# Patient Record
Sex: Male | Born: 1940 | Race: White | Hispanic: No | Marital: Married | Smoking: Never smoker
Health system: Southern US, Community
[De-identification: ages and names within clinical notes are randomized; demographics above are authoritative.]

## PROBLEM LIST (undated history)

## (undated) DIAGNOSIS — K219 Gastro-esophageal reflux disease without esophagitis: Secondary | ICD-10-CM

## (undated) DIAGNOSIS — M1990 Unspecified osteoarthritis, unspecified site: Secondary | ICD-10-CM

## (undated) DIAGNOSIS — M353 Polymyalgia rheumatica: Secondary | ICD-10-CM

## (undated) DIAGNOSIS — Z974 Presence of external hearing-aid: Secondary | ICD-10-CM

## (undated) DIAGNOSIS — G4730 Sleep apnea, unspecified: Secondary | ICD-10-CM

## (undated) HISTORY — PX: COLONOSCOPY: SHX174

---

## 2010-12-13 DIAGNOSIS — K5730 Diverticulosis of large intestine without perforation or abscess without bleeding: Secondary | ICD-10-CM | POA: Insufficient documentation

## 2010-12-13 DIAGNOSIS — N400 Enlarged prostate without lower urinary tract symptoms: Secondary | ICD-10-CM | POA: Insufficient documentation

## 2010-12-13 DIAGNOSIS — E785 Hyperlipidemia, unspecified: Secondary | ICD-10-CM | POA: Insufficient documentation

## 2010-12-13 DIAGNOSIS — H9190 Unspecified hearing loss, unspecified ear: Secondary | ICD-10-CM | POA: Insufficient documentation

## 2010-12-13 DIAGNOSIS — K589 Irritable bowel syndrome without diarrhea: Secondary | ICD-10-CM | POA: Insufficient documentation

## 2010-12-24 DIAGNOSIS — M19011 Primary osteoarthritis, right shoulder: Secondary | ICD-10-CM | POA: Insufficient documentation

## 2011-01-21 DIAGNOSIS — H02059 Trichiasis without entropian unspecified eye, unspecified eyelid: Secondary | ICD-10-CM | POA: Insufficient documentation

## 2011-01-21 DIAGNOSIS — H02209 Unspecified lagophthalmos unspecified eye, unspecified eyelid: Secondary | ICD-10-CM | POA: Insufficient documentation

## 2011-10-14 DIAGNOSIS — K2970 Gastritis, unspecified, without bleeding: Secondary | ICD-10-CM | POA: Insufficient documentation

## 2013-07-20 DIAGNOSIS — M159 Polyosteoarthritis, unspecified: Secondary | ICD-10-CM | POA: Insufficient documentation

## 2014-07-27 DIAGNOSIS — L600 Ingrowing nail: Secondary | ICD-10-CM | POA: Insufficient documentation

## 2014-08-11 DIAGNOSIS — D696 Thrombocytopenia, unspecified: Secondary | ICD-10-CM | POA: Insufficient documentation

## 2016-04-16 DIAGNOSIS — M65341 Trigger finger, right ring finger: Secondary | ICD-10-CM | POA: Insufficient documentation

## 2016-04-16 DIAGNOSIS — M19041 Primary osteoarthritis, right hand: Secondary | ICD-10-CM | POA: Insufficient documentation

## 2017-08-06 DIAGNOSIS — M1712 Unilateral primary osteoarthritis, left knee: Secondary | ICD-10-CM | POA: Insufficient documentation

## 2018-08-12 DIAGNOSIS — M154 Erosive (osteo)arthritis: Secondary | ICD-10-CM | POA: Insufficient documentation

## 2018-08-12 DIAGNOSIS — M353 Polymyalgia rheumatica: Secondary | ICD-10-CM | POA: Insufficient documentation

## 2018-08-12 DIAGNOSIS — Z862 Personal history of diseases of the blood and blood-forming organs and certain disorders involving the immune mechanism: Secondary | ICD-10-CM | POA: Insufficient documentation

## 2018-08-12 DIAGNOSIS — Z7952 Long term (current) use of systemic steroids: Secondary | ICD-10-CM | POA: Insufficient documentation

## 2018-11-29 DIAGNOSIS — H6692 Otitis media, unspecified, left ear: Secondary | ICD-10-CM | POA: Insufficient documentation

## 2019-11-29 DIAGNOSIS — R739 Hyperglycemia, unspecified: Secondary | ICD-10-CM | POA: Insufficient documentation

## 2020-02-16 ENCOUNTER — Encounter: Payer: Self-pay | Admitting: Ophthalmology

## 2020-02-16 ENCOUNTER — Other Ambulatory Visit: Payer: Self-pay

## 2020-02-20 ENCOUNTER — Other Ambulatory Visit
Admission: RE | Admit: 2020-02-20 | Discharge: 2020-02-20 | Disposition: A | Discharge status: Home / Self Care | Payer: Medicare Other | Source: Ambulatory Visit | Attending: Ophthalmology | Admitting: Ophthalmology

## 2020-02-20 ENCOUNTER — Other Ambulatory Visit: Payer: Self-pay

## 2020-02-20 DIAGNOSIS — Z20822 Contact with and (suspected) exposure to covid-19: Secondary | ICD-10-CM | POA: Diagnosis not present

## 2020-02-20 DIAGNOSIS — Z01812 Encounter for preprocedural laboratory examination: Secondary | ICD-10-CM | POA: Diagnosis present

## 2020-02-20 LAB — SARS CORONAVIRUS 2 (TAT 6-24 HRS): SARS Coronavirus 2: NEGATIVE

## 2020-02-20 NOTE — Discharge Instructions (Signed)

## 2020-02-22 ENCOUNTER — Ambulatory Visit: Payer: Medicare Other | Admitting: Anesthesiology

## 2020-02-22 ENCOUNTER — Encounter: Payer: Self-pay | Admitting: Ophthalmology

## 2020-02-22 ENCOUNTER — Other Ambulatory Visit: Payer: Self-pay

## 2020-02-22 ENCOUNTER — Ambulatory Visit
Admission: RE | Admit: 2020-02-22 | Discharge: 2020-02-22 | Disposition: A | Discharge status: Home / Self Care | Payer: Medicare Other | Attending: Ophthalmology | Admitting: Ophthalmology

## 2020-02-22 ENCOUNTER — Encounter
Admission: RE | Disposition: A | Discharge status: Home / Self Care | Payer: Self-pay | Source: Home / Self Care | Attending: Ophthalmology

## 2020-02-22 DIAGNOSIS — G4730 Sleep apnea, unspecified: Secondary | ICD-10-CM | POA: Insufficient documentation

## 2020-02-22 DIAGNOSIS — N400 Enlarged prostate without lower urinary tract symptoms: Secondary | ICD-10-CM | POA: Insufficient documentation

## 2020-02-22 DIAGNOSIS — M353 Polymyalgia rheumatica: Secondary | ICD-10-CM | POA: Diagnosis not present

## 2020-02-22 DIAGNOSIS — H2511 Age-related nuclear cataract, right eye: Secondary | ICD-10-CM | POA: Insufficient documentation

## 2020-02-22 DIAGNOSIS — Z7952 Long term (current) use of systemic steroids: Secondary | ICD-10-CM | POA: Diagnosis not present

## 2020-02-22 DIAGNOSIS — M1990 Unspecified osteoarthritis, unspecified site: Secondary | ICD-10-CM | POA: Diagnosis not present

## 2020-02-22 DIAGNOSIS — K219 Gastro-esophageal reflux disease without esophagitis: Secondary | ICD-10-CM | POA: Diagnosis not present

## 2020-02-22 DIAGNOSIS — Z79899 Other long term (current) drug therapy: Secondary | ICD-10-CM | POA: Diagnosis not present

## 2020-02-22 DIAGNOSIS — E7800 Pure hypercholesterolemia, unspecified: Secondary | ICD-10-CM | POA: Diagnosis not present

## 2020-02-22 HISTORY — DX: Gastro-esophageal reflux disease without esophagitis: K21.9

## 2020-02-22 HISTORY — DX: Sleep apnea, unspecified: G47.30

## 2020-02-22 HISTORY — DX: Unspecified osteoarthritis, unspecified site: M19.90

## 2020-02-22 HISTORY — PX: CATARACT EXTRACTION W/PHACO: SHX586

## 2020-02-22 HISTORY — DX: Polymyalgia rheumatica: M35.3

## 2020-02-22 HISTORY — DX: Presence of external hearing-aid: Z97.4

## 2020-02-22 SURGERY — PHACOEMULSIFICATION, CATARACT, WITH IOL INSERTION
Anesthesia: Monitor Anesthesia Care | Site: Eye | Laterality: Right

## 2020-02-22 MED ORDER — MIDAZOLAM HCL 2 MG/2ML IJ SOLN
INTRAMUSCULAR | Status: DC | PRN
  Administered 2020-02-22 (×2): 1 mg via INTRAVENOUS

## 2020-02-22 MED ORDER — TETRACAINE HCL 0.5 % OP SOLN
1.0000 [drp] | OPHTHALMIC | Status: DC | PRN
  Administered 2020-02-22 (×3): 1 [drp] via OPHTHALMIC

## 2020-02-22 MED ORDER — LIDOCAINE HCL (PF) 2 % IJ SOLN
INTRAOCULAR | Status: DC | PRN
  Administered 2020-02-22: 2 mL

## 2020-02-22 MED ORDER — FENTANYL CITRATE (PF) 100 MCG/2ML IJ SOLN
INTRAMUSCULAR | Status: DC | PRN
  Administered 2020-02-22 (×2): 50 ug via INTRAVENOUS

## 2020-02-22 MED ORDER — ARMC OPHTHALMIC DILATING DROPS
1.0000 "application " | OPHTHALMIC | Status: DC | PRN
  Administered 2020-02-22 (×3): 1 via OPHTHALMIC

## 2020-02-22 MED ORDER — NA HYALUR & NA CHOND-NA HYALUR 0.4-0.35 ML IO KIT
PACK | INTRAOCULAR | Status: DC | PRN
  Administered 2020-02-22: 1 mL via INTRAOCULAR

## 2020-02-22 MED ORDER — CEFUROXIME OPHTHALMIC INJECTION 1 MG/0.1 ML
INJECTION | OPHTHALMIC | Status: DC | PRN
  Administered 2020-02-22: 0.1 mL via INTRACAMERAL

## 2020-02-22 MED ORDER — EPINEPHRINE PF 1 MG/ML IJ SOLN
INTRAOCULAR | Status: DC | PRN
  Administered 2020-02-22: 79 mL via OPHTHALMIC

## 2020-02-22 MED ORDER — MOXIFLOXACIN HCL 0.5 % OP SOLN
1.0000 [drp] | OPHTHALMIC | Status: DC | PRN
  Administered 2020-02-22 (×3): 1 [drp] via OPHTHALMIC

## 2020-02-22 MED ORDER — BRIMONIDINE TARTRATE-TIMOLOL 0.2-0.5 % OP SOLN
OPHTHALMIC | Status: DC | PRN
  Administered 2020-02-22: 1 [drp] via OPHTHALMIC

## 2020-02-22 SURGICAL SUPPLY — 23 items
CANNULA ANT/CHMB 27G (MISCELLANEOUS) ×1 IMPLANT
CANNULA ANT/CHMB 27GA (MISCELLANEOUS) ×2 IMPLANT
GLOVE SURG LX 7.5 STRW (GLOVE) ×1
GLOVE SURG LX STRL 7.5 STRW (GLOVE) ×1 IMPLANT
GLOVE SURG TRIUMPH 8.0 PF LTX (GLOVE) ×2 IMPLANT
GOWN STRL REUS W/ TWL LRG LVL3 (GOWN DISPOSABLE) ×2 IMPLANT
GOWN STRL REUS W/TWL LRG LVL3 (GOWN DISPOSABLE) ×2
LENS IOL DIOP 22.0 (Intraocular Lens) ×2 IMPLANT
LENS IOL TECNIS MONO 22.0 (Intraocular Lens) IMPLANT
MARKER SKIN DUAL TIP RULER LAB (MISCELLANEOUS) ×2 IMPLANT
NDL CAPSULORHEX 25GA (NEEDLE) ×1 IMPLANT
NDL FILTER BLUNT 18X1 1/2 (NEEDLE) ×2 IMPLANT
NEEDLE CAPSULORHEX 25GA (NEEDLE) ×2 IMPLANT
NEEDLE FILTER BLUNT 18X 1/2SAF (NEEDLE) ×2
NEEDLE FILTER BLUNT 18X1 1/2 (NEEDLE) ×2 IMPLANT
PACK CATARACT BRASINGTON (MISCELLANEOUS) ×2 IMPLANT
PACK EYE AFTER SURG (MISCELLANEOUS) ×2 IMPLANT
PACK OPTHALMIC (MISCELLANEOUS) ×2 IMPLANT
SOLUTION OPHTHALMIC SALT (MISCELLANEOUS) ×2 IMPLANT
SYR 3ML LL SCALE MARK (SYRINGE) ×4 IMPLANT
SYR TB 1ML LUER SLIP (SYRINGE) ×2 IMPLANT
WATER STERILE IRR 250ML POUR (IV SOLUTION) ×2 IMPLANT
WIPE NON LINTING 3.25X3.25 (MISCELLANEOUS) ×2 IMPLANT

## 2020-02-22 NOTE — Anesthesia Procedure Notes (Signed)
Procedure Name: MAC Date/Time: 02/22/2020 8:11 AM Performed by: Vanetta Shawl, CRNA Pre-anesthesia Checklist: Patient identified, Emergency Drugs available, Suction available, Timeout performed and Patient being monitored Patient Re-evaluated:Patient Re-evaluated prior to induction Oxygen Delivery Method: Nasal cannula Placement Confirmation: positive ETCO2

## 2020-02-22 NOTE — Anesthesia Preprocedure Evaluation (Signed)
Anesthesia Evaluation  Patient identified by MRN, date of birth, ID band Patient awake    History of Anesthesia Complications Negative for: history of anesthetic complications  Airway Mallampati: II  TM Distance: >3 FB Neck ROM: Full    Dental no notable dental hx.    Pulmonary sleep apnea ,    Pulmonary exam normal        Cardiovascular Exercise Tolerance: Good negative cardio ROS Normal cardiovascular exam     Neuro/Psych negative neurological ROS  negative psych ROS   GI/Hepatic Neg liver ROS, GERD  Medicated and Controlled,  Endo/Other  negative endocrine ROS  Renal/GU negative Renal ROS     Musculoskeletal Polymyalgia rheumatica   Abdominal   Peds  Hematology negative hematology ROS (+)   Anesthesia Other Findings   Reproductive/Obstetrics                             Anesthesia Physical Anesthesia Plan  ASA: III  Anesthesia Plan: MAC   Post-op Pain Management:    Induction: Intravenous  PONV Risk Score and Plan: 1 and Midazolam  Airway Management Planned: Nasal Cannula and Natural Airway  Additional Equipment: None  Intra-op Plan:   Post-operative Plan:   Informed Consent: I have reviewed the patients History and Physical, chart, labs and discussed the procedure including the risks, benefits and alternatives for the proposed anesthesia with the patient or authorized representative who has indicated his/her understanding and acceptance.       Plan Discussed with: CRNA  Anesthesia Plan Comments:         Anesthesia Quick Evaluation

## 2020-02-22 NOTE — Op Note (Signed)
LOCATION:  [facility]   PREOPERATIVE DIAGNOSIS:    Nuclear sclerotic cataract right eye. H25.11   POSTOPERATIVE DIAGNOSIS:  Nuclear sclerotic cataract right eye.     PROCEDURE:  Phacoemusification with posterior chamber intraocular lens placement of the right eye   ULTRASOUND TIME: Procedure(s) with comments: CATARACT EXTRACTION PHACO AND INTRAOCULAR LENS PLACEMENT (IOC) RIGHT 14.63 01:24.4 17.3% (Right) - sleep apnea  LENS:   Implant Name Type Inv. Item Serial No. Manufacturer Lot No. LRB No. Used Action  LENS IOL DIOP 22.0 - [id number] Intraocular Lens LENS IOL DIOP 22.0 [id number]  Right 1 Implanted         SURGEON:  Wyonia Hough, MD   ANESTHESIA:  Topical with tetracaine drops and 2% Xylocaine jelly, augmented with 1% preservative-free intracameral lidocaine.    COMPLICATIONS:  None.   DESCRIPTION OF PROCEDURE:  The patient was identified in the holding room and transported to the operating room and placed in the supine position under the operating microscope.  The right eye was identified as the operative eye and it was prepped and draped in the usual sterile ophthalmic fashion.   A 1 millimeter clear-corneal paracentesis was made at the 12:00 position.  0.5 ml of preservative-free 1% lidocaine was injected into the anterior chamber. The anterior chamber was filled with Viscoat viscoelastic.  A 2.4 millimeter keratome was used to make a near-clear corneal incision at the 9:00 position.  A curvilinear capsulorrhexis was made with a cystotome and capsulorrhexis forceps.  Balanced salt solution was used to hydrodissect and hydrodelineate the nucleus.   Phacoemulsification was then used in stop and chop fashion to remove the lens nucleus and epinucleus.  The remaining cortex was then removed using the irrigation and aspiration handpiece. Provisc was then placed into the capsular bag to distend it for lens placement.  A lens was then injected into the capsular  bag.  The remaining viscoelastic was aspirated.   Wounds were hydrated with balanced salt solution.  The anterior chamber was inflated to a physiologic pressure with balanced salt solution.  No wound leaks were noted. Cefuroxime 0.1 ml of a 10mg /ml solution was injected into the anterior chamber for a dose of 1 mg of intracameral antibiotic at the completion of the case.   Timolol and Brimonidine drops were applied to the eye.  The patient was taken to the recovery room in stable condition without complications of anesthesia or surgery.   Hellen Shanley 02/22/2020, 8:32 AM

## 2020-02-22 NOTE — H&P (Signed)

## 2020-02-22 NOTE — Transfer of Care (Signed)
Immediate Anesthesia Transfer of Care Note  Patient: Benjamin Deleon  Procedure(s) Performed: CATARACT EXTRACTION PHACO AND INTRAOCULAR LENS PLACEMENT (IOC) RIGHT 14.63 01:24.4 17.3% (Right Eye)  Patient Location: PACU  Anesthesia Type: MAC  Level of Consciousness: awake, alert  and patient cooperative  Airway and Oxygen Therapy: Patient Spontanous Breathing   Post-op Assessment: Post-op Vital signs reviewed, Patient's Cardiovascular Status Stable, Respiratory Function Stable, Patent Airway and No signs of Nausea or vomiting  Post-op Vital Signs: Reviewed and stable  Complications: No apparent anesthesia complications

## 2020-02-22 NOTE — Anesthesia Postprocedure Evaluation (Signed)
Anesthesia Post Note  Patient: Benjamin Deleon  Procedure(s) Performed: CATARACT EXTRACTION PHACO AND INTRAOCULAR LENS PLACEMENT (IOC) RIGHT 14.63 01:24.4 17.3% (Right Eye)     Patient location during evaluation: PACU Anesthesia Type: MAC Level of consciousness: awake and alert Pain management: pain level controlled Vital Signs Assessment: post-procedure vital signs reviewed and stable Respiratory status: spontaneous breathing Cardiovascular status: blood pressure returned to baseline Postop Assessment: no apparent nausea or vomiting, adequate PO intake and no headache Anesthetic complications: no    Adele Barthel Tagg Eustice

## 2020-02-23 ENCOUNTER — Encounter: Payer: Self-pay | Admitting: *Deleted

## 2020-03-12 ENCOUNTER — Encounter: Payer: Self-pay | Admitting: Ophthalmology

## 2020-03-19 ENCOUNTER — Other Ambulatory Visit
Admission: RE | Admit: 2020-03-19 | Discharge: 2020-03-19 | Disposition: A | Discharge status: Home / Self Care | Payer: Medicare Other | Source: Ambulatory Visit | Attending: Ophthalmology | Admitting: Ophthalmology

## 2020-03-19 ENCOUNTER — Other Ambulatory Visit: Payer: Self-pay

## 2020-03-19 DIAGNOSIS — Z01812 Encounter for preprocedural laboratory examination: Secondary | ICD-10-CM | POA: Diagnosis present

## 2020-03-19 DIAGNOSIS — Z20822 Contact with and (suspected) exposure to covid-19: Secondary | ICD-10-CM | POA: Insufficient documentation

## 2020-03-19 LAB — SARS CORONAVIRUS 2 (TAT 6-24 HRS): SARS Coronavirus 2: NEGATIVE

## 2020-03-20 NOTE — Discharge Instructions (Signed)

## 2020-03-21 ENCOUNTER — Ambulatory Visit: Payer: Medicare Other | Admitting: Anesthesiology

## 2020-03-21 ENCOUNTER — Ambulatory Visit
Admission: RE | Admit: 2020-03-21 | Discharge: 2020-03-21 | Disposition: A | Discharge status: Home / Self Care | Payer: Medicare Other | Attending: Ophthalmology | Admitting: Ophthalmology

## 2020-03-21 ENCOUNTER — Encounter: Payer: Self-pay | Admitting: Ophthalmology

## 2020-03-21 ENCOUNTER — Other Ambulatory Visit: Payer: Self-pay

## 2020-03-21 ENCOUNTER — Encounter
Admission: RE | Disposition: A | Discharge status: Home / Self Care | Payer: Self-pay | Source: Home / Self Care | Attending: Ophthalmology

## 2020-03-21 DIAGNOSIS — E7800 Pure hypercholesterolemia, unspecified: Secondary | ICD-10-CM | POA: Diagnosis not present

## 2020-03-21 DIAGNOSIS — H2512 Age-related nuclear cataract, left eye: Secondary | ICD-10-CM | POA: Diagnosis not present

## 2020-03-21 DIAGNOSIS — K219 Gastro-esophageal reflux disease without esophagitis: Secondary | ICD-10-CM | POA: Diagnosis not present

## 2020-03-21 DIAGNOSIS — M1990 Unspecified osteoarthritis, unspecified site: Secondary | ICD-10-CM | POA: Diagnosis not present

## 2020-03-21 DIAGNOSIS — Z9841 Cataract extraction status, right eye: Secondary | ICD-10-CM | POA: Diagnosis not present

## 2020-03-21 DIAGNOSIS — N400 Enlarged prostate without lower urinary tract symptoms: Secondary | ICD-10-CM | POA: Diagnosis not present

## 2020-03-21 DIAGNOSIS — M353 Polymyalgia rheumatica: Secondary | ICD-10-CM | POA: Diagnosis not present

## 2020-03-21 DIAGNOSIS — G4730 Sleep apnea, unspecified: Secondary | ICD-10-CM | POA: Diagnosis not present

## 2020-03-21 HISTORY — PX: CATARACT EXTRACTION W/PHACO: SHX586

## 2020-03-21 SURGERY — PHACOEMULSIFICATION, CATARACT, WITH IOL INSERTION
Anesthesia: Monitor Anesthesia Care | Site: Eye | Laterality: Left

## 2020-03-21 MED ORDER — CEFUROXIME OPHTHALMIC INJECTION 1 MG/0.1 ML
INJECTION | OPHTHALMIC | Status: DC | PRN
  Administered 2020-03-21: 0.1 mL via INTRACAMERAL

## 2020-03-21 MED ORDER — EPINEPHRINE PF 1 MG/ML IJ SOLN
INTRAOCULAR | Status: DC | PRN
  Administered 2020-03-21: 75 mL via OPHTHALMIC

## 2020-03-21 MED ORDER — ACETAMINOPHEN 160 MG/5ML PO SOLN: 325.0000 mg | Freq: Once | ORAL | Status: DC

## 2020-03-21 MED ORDER — FENTANYL CITRATE (PF) 100 MCG/2ML IJ SOLN
INTRAMUSCULAR | Status: DC | PRN
  Administered 2020-03-21 (×2): 50 ug via INTRAVENOUS

## 2020-03-21 MED ORDER — MIDAZOLAM HCL 2 MG/2ML IJ SOLN
INTRAMUSCULAR | Status: DC | PRN
  Administered 2020-03-21 (×2): 1 mg via INTRAVENOUS

## 2020-03-21 MED ORDER — MOXIFLOXACIN HCL 0.5 % OP SOLN
1.0000 [drp] | OPHTHALMIC | Status: DC | PRN
  Administered 2020-03-21 (×3): 1 [drp] via OPHTHALMIC

## 2020-03-21 MED ORDER — ARMC OPHTHALMIC DILATING DROPS
1.0000 "application " | OPHTHALMIC | Status: DC | PRN
  Administered 2020-03-21 (×3): 1 via OPHTHALMIC

## 2020-03-21 MED ORDER — ACETAMINOPHEN 325 MG PO TABS: 325.0000 mg | ORAL_TABLET | Freq: Once | ORAL | Status: DC

## 2020-03-21 MED ORDER — NA HYALUR & NA CHOND-NA HYALUR 0.4-0.35 ML IO KIT
PACK | INTRAOCULAR | Status: DC | PRN
  Administered 2020-03-21: 1 mL via INTRAOCULAR

## 2020-03-21 MED ORDER — LIDOCAINE HCL (PF) 2 % IJ SOLN
INTRAOCULAR | Status: DC | PRN
  Administered 2020-03-21: 1 mL

## 2020-03-21 MED ORDER — TETRACAINE HCL 0.5 % OP SOLN
1.0000 [drp] | OPHTHALMIC | Status: DC | PRN
  Administered 2020-03-21 (×3): 1 [drp] via OPHTHALMIC

## 2020-03-21 MED ORDER — LACTATED RINGERS IV SOLN: 10.0000 mL/h | INTRAVENOUS | Status: DC

## 2020-03-21 MED ORDER — BRIMONIDINE TARTRATE-TIMOLOL 0.2-0.5 % OP SOLN
OPHTHALMIC | Status: DC | PRN
  Administered 2020-03-21: 1 [drp] via OPHTHALMIC

## 2020-03-21 SURGICAL SUPPLY — 23 items
CANNULA ANT/CHMB 27G (MISCELLANEOUS) ×1 IMPLANT
CANNULA ANT/CHMB 27GA (MISCELLANEOUS) ×3 IMPLANT
GLOVE SURG LX 7.5 STRW (GLOVE) ×2
GLOVE SURG LX STRL 7.5 STRW (GLOVE) ×1 IMPLANT
GLOVE SURG TRIUMPH 8.0 PF LTX (GLOVE) ×3 IMPLANT
GOWN STRL REUS W/ TWL LRG LVL3 (GOWN DISPOSABLE) ×2 IMPLANT
GOWN STRL REUS W/TWL LRG LVL3 (GOWN DISPOSABLE) ×4
LENS IOL DIOP 20.0 (Intraocular Lens) ×3 IMPLANT
LENS IOL TECNIS MONO 20.0 (Intraocular Lens) IMPLANT
MARKER SKIN DUAL TIP RULER LAB (MISCELLANEOUS) ×3 IMPLANT
NDL CAPSULORHEX 25GA (NEEDLE) ×1 IMPLANT
NDL FILTER BLUNT 18X1 1/2 (NEEDLE) ×2 IMPLANT
NEEDLE CAPSULORHEX 25GA (NEEDLE) ×3 IMPLANT
NEEDLE FILTER BLUNT 18X 1/2SAF (NEEDLE) ×4
NEEDLE FILTER BLUNT 18X1 1/2 (NEEDLE) ×2 IMPLANT
PACK CATARACT BRASINGTON (MISCELLANEOUS) ×3 IMPLANT
PACK EYE AFTER SURG (MISCELLANEOUS) ×3 IMPLANT
PACK OPTHALMIC (MISCELLANEOUS) ×3 IMPLANT
SOLUTION OPHTHALMIC SALT (MISCELLANEOUS) ×3 IMPLANT
SYR 3ML LL SCALE MARK (SYRINGE) ×6 IMPLANT
SYR TB 1ML LUER SLIP (SYRINGE) ×3 IMPLANT
WATER STERILE IRR 250ML POUR (IV SOLUTION) ×3 IMPLANT
WIPE NON LINTING 3.25X3.25 (MISCELLANEOUS) ×3 IMPLANT

## 2020-03-21 NOTE — Transfer of Care (Signed)
Immediate Anesthesia Transfer of Care Note  Patient: Benjamin Deleon  Procedure(s) Performed: CATARACT EXTRACTION PHACO AND INTRAOCULAR LENS PLACEMENT (IOC) LEFT 8.85  01:05.1  13.6% (Left Eye)  Patient Location: PACU  Anesthesia Type: MAC  Level of Consciousness: awake, alert  and patient cooperative  Airway and Oxygen Therapy: Patient Spontanous Breathing   Post-op Assessment: Post-op Vital signs reviewed, Patient's Cardiovascular Status Stable, Respiratory Function Stable, Patent Airway and No signs of Nausea or vomiting  Post-op Vital Signs: Reviewed and stable  Complications: No complications documented.

## 2020-03-21 NOTE — Anesthesia Postprocedure Evaluation (Signed)
Anesthesia Post Note  Patient: Benjamin Deleon  Procedure(s) Performed: CATARACT EXTRACTION PHACO AND INTRAOCULAR LENS PLACEMENT (IOC) LEFT 8.85  01:05.1  13.6% (Left Eye)     Patient location during evaluation: PACU Anesthesia Type: MAC Level of consciousness: awake and alert and oriented Pain management: satisfactory to patient Vital Signs Assessment: post-procedure vital signs reviewed and stable Respiratory status: spontaneous breathing, nonlabored ventilation and respiratory function stable Cardiovascular status: blood pressure returned to baseline and stable Postop Assessment: Adequate PO intake and No signs of nausea or vomiting Anesthetic complications: no   No complications documented.  Raliegh Ip

## 2020-03-21 NOTE — Op Note (Signed)
OPERATIVE NOTE  Benjamin Deleon [id number] 03/21/2020   PREOPERATIVE DIAGNOSIS:  Nuclear sclerotic cataract left eye. H25.12   POSTOPERATIVE DIAGNOSIS:    Nuclear sclerotic cataract left eye.     PROCEDURE:  Phacoemusification with posterior chamber intraocular lens placement of the left eye  Ultrasound time: Procedure(s): CATARACT EXTRACTION PHACO AND INTRAOCULAR LENS PLACEMENT (IOC) LEFT 8.85  01:05.1  13.6% (Left)  LENS:   Implant Name Type Inv. Item Serial No. Manufacturer Lot No. LRB No. Used Action  LENS IOL DIOP 20.0 - [id number] Intraocular Lens LENS IOL DIOP 20.0 [id number]  Left 1 Implanted      SURGEON:  Wyonia Hough, MD   ANESTHESIA:  Topical with tetracaine drops and 2% Xylocaine jelly, augmented with 1% preservative-free intracameral lidocaine.    COMPLICATIONS:  None.   DESCRIPTION OF PROCEDURE:  The patient was identified in the holding room and transported to the operating room and placed in the supine position under the operating microscope.  The left eye was identified as the operative eye and it was prepped and draped in the usual sterile ophthalmic fashion.   A 1 millimeter clear-corneal paracentesis was made at the 1:30 position.  0.5 ml of preservative-free 1% lidocaine was injected into the anterior chamber.  The anterior chamber was filled with Viscoat viscoelastic.  A 2.4 millimeter keratome was used to make a near-clear corneal incision at the 10:30 position.  .  A curvilinear capsulorrhexis was made with a cystotome and capsulorrhexis forceps.  Balanced salt solution was used to hydrodissect and hydrodelineate the nucleus.   Phacoemulsification was then used in stop and chop fashion to remove the lens nucleus and epinucleus.  The remaining cortex was then removed using the irrigation and aspiration handpiece. Provisc was then placed into the capsular bag to distend it for lens placement.  A lens was then injected into the capsular bag.  The  remaining viscoelastic was aspirated.   Wounds were hydrated with balanced salt solution.  The anterior chamber was inflated to a physiologic pressure with balanced salt solution.  No wound leaks were noted. Cefuroxime 0.1 ml of a 10mg /ml solution was injected into the anterior chamber for a dose of 1 mg of intracameral antibiotic at the completion of the case.   Timolol and Brimonidine drops were applied to the eye.  The patient was taken to the recovery room in stable condition without complications of anesthesia or surgery.  Aleene Swanner 03/21/2020, 12:36 PM

## 2020-03-21 NOTE — Anesthesia Procedure Notes (Signed)
Procedure Name: MAC Date/Time: 03/21/2020 12:15 PM Performed by: Vanetta Shawl, CRNA Pre-anesthesia Checklist: Patient identified, Emergency Drugs available, Suction available, Timeout performed and Patient being monitored Patient Re-evaluated:Patient Re-evaluated prior to induction Oxygen Delivery Method: Nasal cannula Placement Confirmation: positive ETCO2

## 2020-03-21 NOTE — Anesthesia Preprocedure Evaluation (Signed)
Anesthesia Evaluation  Patient identified by MRN, date of birth, ID band Patient awake    Reviewed: Allergy & Precautions, H&P , NPO status , Patient's Chart, lab work & pertinent test results  History of Anesthesia Complications Negative for: history of anesthetic complications  Airway Mallampati: II  TM Distance: >3 FB Neck ROM: full    Dental no notable dental hx.    Pulmonary sleep apnea ,    Pulmonary exam normal breath sounds clear to auscultation       Cardiovascular Exercise Tolerance: Good negative cardio ROS Normal cardiovascular exam Rhythm:regular Rate:Normal     Neuro/Psych negative neurological ROS  negative psych ROS   GI/Hepatic Neg liver ROS, GERD  Medicated and Controlled,  Endo/Other  negative endocrine ROS  Renal/GU negative Renal ROS     Musculoskeletal Polymyalgia rheumatica   Abdominal   Peds  Hematology negative hematology ROS (+)   Anesthesia Other Findings   Reproductive/Obstetrics                             Anesthesia Physical  Anesthesia Plan  ASA: II  Anesthesia Plan: MAC   Post-op Pain Management:    Induction: Intravenous  PONV Risk Score and Plan: 1 and Treatment may vary due to age or medical condition, TIVA and Midazolam  Airway Management Planned: Nasal Cannula and Natural Airway  Additional Equipment: None  Intra-op Plan:   Post-operative Plan:   Informed Consent: I have reviewed the patients History and Physical, chart, labs and discussed the procedure including the risks, benefits and alternatives for the proposed anesthesia with the patient or authorized representative who has indicated his/her understanding and acceptance.     Dental Advisory Given  Plan Discussed with: CRNA  Anesthesia Plan Comments:         Anesthesia Quick Evaluation

## 2020-03-21 NOTE — H&P (Signed)

## 2020-03-22 ENCOUNTER — Encounter: Payer: Self-pay | Admitting: Ophthalmology

## 2020-03-22 ENCOUNTER — Ambulatory Visit: Payer: Self-pay | Admitting: Dermatology

## 2020-06-13 ENCOUNTER — Ambulatory Visit (INDEPENDENT_AMBULATORY_CARE_PROVIDER_SITE_OTHER): Payer: Medicare Other | Admitting: Dermatology

## 2020-06-13 ENCOUNTER — Other Ambulatory Visit: Payer: Self-pay

## 2020-06-13 DIAGNOSIS — D2310 Other benign neoplasm of skin of unspecified eyelid, including canthus: Secondary | ICD-10-CM

## 2020-06-13 DIAGNOSIS — Z1283 Encounter for screening for malignant neoplasm of skin: Secondary | ICD-10-CM | POA: Diagnosis not present

## 2020-06-13 DIAGNOSIS — L821 Other seborrheic keratosis: Secondary | ICD-10-CM

## 2020-06-13 DIAGNOSIS — D2261 Melanocytic nevi of right upper limb, including shoulder: Secondary | ICD-10-CM | POA: Diagnosis not present

## 2020-06-13 DIAGNOSIS — L309 Dermatitis, unspecified: Secondary | ICD-10-CM

## 2020-06-13 DIAGNOSIS — D2262 Melanocytic nevi of left upper limb, including shoulder: Secondary | ICD-10-CM | POA: Diagnosis not present

## 2020-06-13 DIAGNOSIS — L720 Epidermal cyst: Secondary | ICD-10-CM

## 2020-06-13 DIAGNOSIS — L814 Other melanin hyperpigmentation: Secondary | ICD-10-CM

## 2020-06-13 DIAGNOSIS — D239 Other benign neoplasm of skin, unspecified: Secondary | ICD-10-CM

## 2020-06-13 DIAGNOSIS — D229 Melanocytic nevi, unspecified: Secondary | ICD-10-CM

## 2020-06-13 DIAGNOSIS — D492 Neoplasm of unspecified behavior of bone, soft tissue, and skin: Secondary | ICD-10-CM

## 2020-06-13 DIAGNOSIS — L820 Inflamed seborrheic keratosis: Secondary | ICD-10-CM | POA: Diagnosis not present

## 2020-06-13 DIAGNOSIS — D1800 Hemangioma unspecified site: Secondary | ICD-10-CM

## 2020-06-13 DIAGNOSIS — L578 Other skin changes due to chronic exposure to nonionizing radiation: Secondary | ICD-10-CM

## 2020-06-13 HISTORY — DX: Other benign neoplasm of skin, unspecified: D23.9

## 2020-06-13 NOTE — Patient Instructions (Signed)

## 2020-06-13 NOTE — Progress Notes (Signed)
New Patient Visit  Subjective  Benjamin Deleon is a 79 y.o. male who presents for the following: Annual Exam (Total body skin exam, no hx of skin ca, new patient, ), Skin Tag (chest, no symptoms), and growth (post neck, no symptoms). The patient presents for Total-Body Skin Exam (TBSE) for skin cancer screening and mole check.  The following portions of the chart were reviewed this encounter and updated as appropriate:  Tobacco  Allergies  Meds  Problems  Med Hx  Surg Hx  Fam Hx     Review of Systems:  No other skin or systemic complaints except as noted in HPI or Assessment and Plan.  Objective  Well appearing patient in no apparent distress; mood and affect are within normal limits.  A full examination was performed including scalp, head, eyes, ears, nose, lips, neck, chest, axillae, abdomen, back, buttocks, bilateral upper extremities, bilateral lower extremities, hands, feet, fingers, toes, fingernails, and toenails. All findings within normal limits unless otherwise noted below.  Objective  L medial canthus: 4.26mm pap  Images    Objective  L proximal tricep: 0.7 x 0.5cm irregular brown macule  Objective  Right distal lat deltoid: 2.43mm black macule  Objective  Left Popliteal Fossa: Pink patch  Objective  R xyphoid x 1: Erythematous keratotic or waxy stuck-on papule or plaque.   Objective  Right posterior hairline: 0.6cm cystic pap  Objective  Right Upper Arm - Posterior: Dark brown macules   Assessment & Plan    Lentigines - Scattered tan macules - Discussed due to sun exposure - Benign, observe - Call for any changes  Seborrheic Keratoses - Stuck-on, waxy, tan-brown papules and plaques  - Discussed benign etiology and prognosis. - Observe - Call for any changes  Melanocytic Nevi - Tan-brown and/or pink-flesh-colored symmetric macules and papules - Benign appearing on exam today - Observation - Call clinic for new or changing moles -  Recommend daily use of broad spectrum spf 30+ sunscreen to sun-exposed areas.   Hemangiomas - Red papules - Discussed benign nature - Observe - Call for any changes  Actinic Damage - diffuse scaly erythematous macules with underlying dyspigmentation - Recommend daily broad spectrum sunscreen SPF 30+ to sun-exposed areas, reapply every 2 hours as needed.  - Call for new or changing lesions.  Skin cancer screening performed today.  Hydrocystoma L medial canthus Benign appearing, observe  Neoplasm of skin (2) L proximal tricep  Epidermal / dermal shaving  Lesion diameter (cm):  0.7 Informed consent: discussed and consent obtained   Timeout: patient name, date of birth, surgical site, and procedure verified   Procedure prep:  Patient was prepped and draped in usual sterile fashion Prep type:  Isopropyl alcohol Anesthesia: the lesion was anesthetized in a standard fashion   Anesthetic:  1% lidocaine w/ epinephrine 1-100,000 buffered w/ 8.4% NaHCO3 Instrument used: flexible razor blade   Hemostasis achieved with: pressure, aluminum chloride and electrodesiccation   Outcome: patient tolerated procedure well   Post-procedure details: sterile dressing applied and wound care instructions given   Dressing type: bandage and petrolatum    Specimen 1 - Surgical pathology Differential Diagnosis: D48.5 Nevus vs Dysplastic Nevus Check Margins: yes 0.7 x 0.5cm irregular brown macule  Right distal lat deltoid  Skin / nail biopsy Type of biopsy: tangential   Informed consent: discussed and consent obtained   Timeout: patient name, date of birth, surgical site, and procedure verified   Procedure prep:  Patient was prepped and draped in usual  sterile fashion Prep type:  Isopropyl alcohol Anesthesia: the lesion was anesthetized in a standard fashion   Anesthetic:  1% lidocaine w/ epinephrine 1-100,000 buffered w/ 8.4% NaHCO3 Instrument used: flexible razor blade   Outcome: patient  tolerated procedure well   Post-procedure details: sterile dressing applied and wound care instructions given   Dressing type: bandage and petrolatum    Specimen 2 - Surgical pathology Differential Diagnosis: D48.5 Nevus vs Dysplastic Nevus Check Margins: yes 2.76mm black macule  Nevus vs Dysplastic Nevus  Eczema, unspecified type Left Popliteal Fossa Recommend otc Hydrocortisone cream qd prn itch  Inflamed seborrheic keratosis R xyphoid x 1  Destruction of lesion - R xyphoid x 1 Complexity: simple   Destruction method: cryotherapy   Informed consent: discussed and consent obtained   Timeout:  patient name, date of birth, surgical site, and procedure verified Lesion destroyed using liquid nitrogen: Yes   Region frozen until ice ball extended beyond lesion: Yes   Outcome: patient tolerated procedure well with no complications   Post-procedure details: wound care instructions given    Epidermal cyst Right posterior hairline Benign, discussed excising, pt would like to observe  Nevus Right Upper Arm - Posterior Plan removal if bx's from today are abnormal  Return in about 1 year (around 06/13/2021) for TBSE.   I, Othelia Pulling, RMA, am acting as scribe for Sarina Ser, MD .  Documentation: I have reviewed the above documentation for accuracy and completeness, and I agree with the above.  Sarina Ser, MD

## 2020-06-14 ENCOUNTER — Encounter: Payer: Self-pay | Admitting: Dermatology

## 2020-06-17 ENCOUNTER — Encounter: Payer: Self-pay | Admitting: Dermatology

## 2020-06-18 ENCOUNTER — Telehealth: Payer: Self-pay

## 2020-06-18 NOTE — Telephone Encounter (Signed)
Discussed biopsy results with pt  °

## 2020-06-18 NOTE — Telephone Encounter (Signed)
-----   Message from Ralene Bathe, MD sent at 06/17/2020  4:11 PM EDT ----- 1. 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 Skin , right distal lat deltoid DYSPLASTIC JUNCTIONAL NEVUS WITH MILD ATYPIA, LIMITED MARGINS FREE  1&2 - both Dysplastic Mild Recheck next visit Keep Sept 2022 visit

## 2020-07-02 DIAGNOSIS — M25511 Pain in right shoulder: Secondary | ICD-10-CM | POA: Insufficient documentation

## 2020-08-14 ENCOUNTER — Ambulatory Visit (INDEPENDENT_AMBULATORY_CARE_PROVIDER_SITE_OTHER): Payer: Medicare Other | Admitting: Dermatology

## 2020-08-14 ENCOUNTER — Encounter: Payer: Self-pay | Admitting: Dermatology

## 2020-08-14 ENCOUNTER — Other Ambulatory Visit: Payer: Self-pay

## 2020-08-14 DIAGNOSIS — L720 Epidermal cyst: Secondary | ICD-10-CM | POA: Diagnosis not present

## 2020-08-14 MED ORDER — MUPIROCIN 2 % EX OINT: 1.0000 "application " | TOPICAL_OINTMENT | Freq: Every day | CUTANEOUS | 0 refills | Status: DC

## 2020-08-14 NOTE — Patient Instructions (Signed)

## 2020-08-14 NOTE — Progress Notes (Signed)
   Follow-Up Visit   Subjective  Benjamin Deleon is a 79 y.o. male who presents for the following: Cyst (R post hairline, pt presents for excision).  The following portions of the chart were reviewed this encounter and updated as appropriate:  Tobacco  Allergies  Meds  Problems  Med Hx  Surg Hx  Fam Hx     Review of Systems:  No other skin or systemic complaints except as noted in HPI or Assessment and Plan.  Objective  Well appearing patient in no apparent distress; mood and affect are within normal limits.  A focused examination was performed including scalp. Relevant physical exam findings are noted in the Assessment and Plan.  Objective  R posterior hairline: Cystic pap 1.0cm   Assessment & Plan  Epidermal cyst -symptomatic and growing R posterior hairline -neck  Start Mupirocin oint qd to excision site with dressing changes  mupirocin ointment (BACTROBAN) 2 % - R posterior hairline  Skin excision - R posterior hairline neck  Lesion length (cm):  1 Lesion width (cm):  1 Margin per side (cm):  0.1 Total excision diameter (cm):  1.2 Informed consent: discussed and consent obtained   Timeout: patient name, date of birth, surgical site, and procedure verified   Procedure prep:  Patient was prepped and draped in usual sterile fashion Prep type:  Isopropyl alcohol and povidone-iodine Anesthesia: the lesion was anesthetized in a standard fashion   Anesthetic:  1% lidocaine w/ epinephrine 1-100,000 buffered w/ 8.4% NaHCO3 (6.0cc) Instrument used comment:  15c blade Hemostasis achieved with: pressure   Hemostasis achieved with comment:  Electrocautery Outcome: patient tolerated procedure well with no complications   Post-procedure details: sterile dressing applied and wound care instructions given   Dressing type: bandage, pressure dressing and bacitracin (Mupirocin)    Skin repair - R posterior hairline neck Complexity:  Complex Final length (cm):  2 Reason for  type of repair: reduce tension to allow closure, reduce the risk of dehiscence, infection, and necrosis, reduce subcutaneous dead space and avoid a hematoma, allow closure of the large defect, preserve normal anatomy, preserve normal anatomical and functional relationships and enhance both functionality and cosmetic results   Undermining: area extensively undermined   Undermining comment:  Undermining Defect 1.2cm Subcutaneous layers (deep stitches):  Suture size:  4-0 Suture type: Vicryl (polyglactin 910)   Subcutaneous suture technique: Inverted Dermal. Fine/surface layer approximation (top stitches):  Suture size:  4-0 Suture type: nylon   Stitches: horizontal mattress   Suture removal (days):  7 Hemostasis achieved with: pressure Outcome: patient tolerated procedure well with no complications   Post-procedure details: sterile dressing applied and wound care instructions given   Dressing type: bandage, pressure dressing and bacitracin (Mupirocin)    Specimen 1 - Surgical pathology Differential Diagnosis: D48.5 Cyst vs other Check Margins: No Cystic pap 1.0cm  Return in about 1 week (around 08/21/2020) for suture removal.  I, Othelia Pulling, RMA, am acting as scribe for Sarina Ser, MD .  Documentation: I have reviewed the above documentation for accuracy and completeness, and I agree with the above.  Sarina Ser, MD

## 2020-08-15 ENCOUNTER — Telehealth: Payer: Self-pay

## 2020-08-15 NOTE — Telephone Encounter (Signed)
Patient do good after yesterday's surgery.Benjamin Deleon

## 2020-08-21 ENCOUNTER — Ambulatory Visit (INDEPENDENT_AMBULATORY_CARE_PROVIDER_SITE_OTHER): Payer: Medicare Other | Admitting: Dermatology

## 2020-08-21 ENCOUNTER — Other Ambulatory Visit: Payer: Self-pay

## 2020-08-21 DIAGNOSIS — L720 Epidermal cyst: Secondary | ICD-10-CM

## 2020-08-21 DIAGNOSIS — Z4802 Encounter for removal of sutures: Secondary | ICD-10-CM

## 2020-08-21 NOTE — Progress Notes (Signed)
   Follow-Up Visit   Subjective  Benjamin Deleon is a 79 y.o. male who presents for the following: Cyst (pathology proven, patient is here today for suture removal S/P excision).  The following portions of the chart were reviewed this encounter and updated as appropriate:  Tobacco  Allergies  Meds  Problems  Med Hx  Surg Hx  Fam Hx     Review of Systems:  No other skin or systemic complaints except as noted in HPI or Assessment and Plan.  Objective  Well appearing patient in no apparent distress; mood and affect are within normal limits.  A focused examination was performed including the scalp and neck. Relevant physical exam findings are noted in the Assessment and Plan.  Objective  R post hairline/neck: Healing excision site  Assessment & Plan  Epidermal inclusion cyst R post hairline/neck  Encounter for Removal of Sutures - Incision site at the R post hairline/neck is clean, dry and intact - Wound cleansed, sutures removed, wound cleansed and steri strips applied.  - Discussed pathology results showing a benign cyst.  - Patient advised to keep steri-strips dry until they fall off. - Scars remodel for a full year. - Once steri-strips fall off, patient can apply over-the-counter silicone scar cream each night to help with scar remodeling if desired. - Patient advised to call with any concerns or if they notice any new or changing lesions.   Return for appointment as scheduled for TBSE .  [name], CMA, am acting as scribe for Sarina Ser, MD .  Documentation: I have reviewed the above documentation for accuracy and completeness, and I agree with the above.  Sarina Ser, MD

## 2020-08-23 ENCOUNTER — Encounter: Payer: Self-pay | Admitting: Dermatology

## 2020-10-12 ENCOUNTER — Ambulatory Visit (INDEPENDENT_AMBULATORY_CARE_PROVIDER_SITE_OTHER): Payer: Medicare Other | Admitting: Urology

## 2020-10-12 ENCOUNTER — Encounter: Payer: Self-pay | Admitting: Urology

## 2020-10-12 ENCOUNTER — Other Ambulatory Visit: Payer: Self-pay

## 2020-10-12 VITALS — BP 146/82 | Ht 69.0 in | Wt 180.0 lb

## 2020-10-12 DIAGNOSIS — N401 Enlarged prostate with lower urinary tract symptoms: Secondary | ICD-10-CM | POA: Diagnosis not present

## 2020-10-12 DIAGNOSIS — R3914 Feeling of incomplete bladder emptying: Secondary | ICD-10-CM | POA: Diagnosis not present

## 2020-10-12 LAB — URINALYSIS, COMPLETE
Bilirubin, UA: NEGATIVE
Glucose, UA: NEGATIVE
Ketones, UA: NEGATIVE
Leukocytes,UA: NEGATIVE
Nitrite, UA: NEGATIVE
Protein,UA: NEGATIVE
Specific Gravity, UA: 1.01 (ref 1.005–1.030)
Urobilinogen, Ur: 0.2 mg/dL (ref 0.2–1.0)
pH, UA: 6.5 (ref 5.0–7.5)

## 2020-10-12 LAB — MICROSCOPIC EXAMINATION
Bacteria, UA: NONE SEEN
Epithelial Cells (non renal): NONE SEEN /hpf (ref 0–10)
WBC, UA: NONE SEEN /hpf (ref 0–5)

## 2020-10-12 LAB — BLADDER SCAN AMB NON-IMAGING: SCA Result: 455

## 2020-10-12 NOTE — Progress Notes (Signed)
10/12/2020 1:10 PM   Benjamin Deleon 07/10/1941 [id number]  Referring provider: Derinda Late, MD [phone] [facility] [place],  [place] [address]  Chief Complaint  Patient presents with  . Benign Prostatic Hypertrophy    HPI: Benjamin Deleon is a 80 y.o. male seen at the request of Dr. Baldemar Lenis for evaluation of BPH with lower urinary tract symptoms.   Seen [facility] 09/13/2020 with complaints of urinary frequency, nocturia x2, decreased stream and sensation of incomplete bladder emptying  Long history of BPH; on finasteride for many years  Tamsulosin added 3-4 years ago  6-8 months ago had worsening lower urinary tract symptoms including urinary frequency, hesitancy and decreased force and caliber of his urinary stream  Symptoms are worse at night  Tamsulosin was increased to 0.8 mg with mild improvement  IPSS 16/35  2-3 months ago he was having mild dysuria and given an empiric trial antibiotics for possible prostatitis.  Urinalysis was negative  PSA 08/2019 1.33    PMH: Past Medical History:  Diagnosis Date  . Arthritis   . 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 Mild atypia, limited margins free.  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 deltoid. Mild atypia, limited margins free.  Marland Kitchen GERD (gastroesophageal reflux disease)   . Polymyalgia rheumatica ([place])   . Sleep apnea    uses dental appliance  . Wears hearing aid in both ears     Surgical History: Past Surgical History:  Procedure Laterality Date  . CATARACT EXTRACTION W/PHACO Right 02/22/2020   Procedure: CATARACT EXTRACTION PHACO AND INTRAOCULAR LENS PLACEMENT (IOC) RIGHT 14.63 01:24.4 17.3%;  Surgeon: Leandrew Koyanagi, MD;  Location: [place];  Service: Ophthalmology;  Laterality: Right;  sleep apnea  . CATARACT EXTRACTION W/PHACO Left 03/21/2020   Procedure: CATARACT EXTRACTION PHACO AND INTRAOCULAR LENS PLACEMENT  (IOC) LEFT 8.85  01:05.1  13.6%;  Surgeon: Leandrew Koyanagi, MD;  Location: [place];  Service: Ophthalmology;  Laterality: Left;  . COLONOSCOPY      Home Medications:  Allergies as of 10/12/2020      Reactions   Lactose Intolerance (gi) Diarrhea   Bloating    Wound Dressing Adhesive Itching      Medication List       Accurate as of October 12, 2020  1:10 PM. If you have any questions, ask your nurse or doctor.        famotidine 10 MG tablet Commonly known as: PEPCID Take 5 mg by mouth 2 (two) times daily.   finasteride 5 MG tablet Commonly known as: PROSCAR Take 5 mg by mouth daily.   folic acid 1 MG tablet Commonly known as: FOLVITE Take 1 mg by mouth daily.   methotrexate 5 MG tablet Commonly known as: RHEUMATREX Take 5 mg by mouth once a week. Caution: Chemotherapy. Protect from light.   mupirocin ointment 2 % Commonly known as: BACTROBAN Apply 1 application topically daily. Qd to excision site   PREDNISONE PO Take 1.5 mg by mouth daily.   simvastatin 5 MG tablet Commonly known as: ZOCOR Take 5 mg by mouth daily.   tamsulosin 0.4 MG Caps capsule Commonly known as: FLOMAX Take 0.8 mg by mouth daily.   Vitamin D3 50 MCG (2000 UT) Tabs Take by mouth daily.       Allergies:  Allergies  Allergen Reactions  . Lactose Intolerance (Gi) Diarrhea    Bloating   . Wound Dressing Adhesive Itching  Family History: History reviewed. No pertinent family history.  Social History:  reports that he has never smoked. He has never used smokeless tobacco. He reports current alcohol use. No history on file for drug use.   Physical Exam: BP (!) 146/82   Ht 5\' 9"  (1.753 m)   Wt 180 lb (81.6 kg)   BMI 26.58 kg/m   Constitutional:  Alert and oriented, No acute distress. HEENT: [place] AT, moist mucus membranes.  Trachea midline, no masses. Cardiovascular: No clubbing, cyanosis, or edema. Respiratory: Normal respiratory effort, no increased work of  breathing. GU: Prostate 45 g, smooth without nodules Skin: No rashes, bruises or suspicious lesions. Neurologic: Grossly intact, no focal deficits, moving all 4 extremities. Psychiatric: Normal mood and affect.   Assessment & Plan:    1. Benign prostatic hyperplasia with incomplete bladder emptying  PVR by bladder scan was initially >700 mL  He was able to void more and repeat PVR 455 mL  He does feel he was nervous and at home is able to empty better than under this setting  He has maxed out on medical therapy and unlikely adding tadalafil would significantly improve his symptoms  Surgical options were discussed in detail including minimally invasive options of UroLift/Rezum.  TURP and HoLEP were also discussed.    He is interested in pursuing an outlet procedure and will schedule cystoscopy and TRUS prostate for volume to determine what may be his best options   Abbie Sons, MD  [facility] [address], [place], [place] [address] [phone]

## 2020-10-25 ENCOUNTER — Other Ambulatory Visit: Payer: Self-pay

## 2020-10-25 ENCOUNTER — Encounter: Payer: Self-pay | Admitting: Podiatry

## 2020-10-25 ENCOUNTER — Ambulatory Visit: Payer: Medicare Other | Admitting: Podiatry

## 2020-10-25 ENCOUNTER — Ambulatory Visit (INDEPENDENT_AMBULATORY_CARE_PROVIDER_SITE_OTHER): Payer: Medicare Other | Admitting: Podiatry

## 2020-10-25 DIAGNOSIS — L600 Ingrowing nail: Secondary | ICD-10-CM | POA: Diagnosis not present

## 2020-10-25 DIAGNOSIS — B351 Tinea unguium: Secondary | ICD-10-CM | POA: Diagnosis not present

## 2020-10-25 NOTE — Progress Notes (Signed)
This patient presents to the office concerned with pain in his left big toe.  He says he has intermittant pain along the borders big toe left.  Patient denies drainage or infection.  .  He has history of nail surgery both great toes.  He does admit to occasional pain right big toe. He presents to the office for evaluation and treatment.  Vascular  Dorsalis pedis and posterior tibial pulses are palpable  B/L.  Capillary return  WNL.  Temperature gradient is  WNL.  Skin turgor  WNL  Sensorium  Senn Weinstein monofilament wire  WNL. Normal tactile sensation.  Nail Exam  Patient has normal nails with no evidence of bacterial .  Incurvation noted both borders both great toes.  Orthopedic  Exam  Muscle tone and muscle strength  WNL.  No limitations of motion feet  B/L.  No crepitus or joint effusion noted.  Foot type is unremarkable and digits show no abnormalities.  Bony prominences are unremarkable.  Skin  No open lesions.  Normal skin texture and turgor.  Ingrown nails Hallux  B/L.  Onychomycosis  Hallux  B/L.  IE.  Debrided hallux nails.  Discussed condition with patient.  RTC prn.  Gardiner Barefoot DPM

## 2020-11-08 ENCOUNTER — Encounter: Payer: Self-pay | Admitting: Urology

## 2020-11-08 ENCOUNTER — Ambulatory Visit (INDEPENDENT_AMBULATORY_CARE_PROVIDER_SITE_OTHER): Payer: Medicare Other | Admitting: Urology

## 2020-11-08 ENCOUNTER — Other Ambulatory Visit: Payer: Self-pay

## 2020-11-08 VITALS — BP 174/92 | HR 73 | Ht 69.0 in | Wt 180.0 lb

## 2020-11-08 DIAGNOSIS — N401 Enlarged prostate with lower urinary tract symptoms: Secondary | ICD-10-CM

## 2020-11-08 DIAGNOSIS — R339 Retention of urine, unspecified: Secondary | ICD-10-CM | POA: Diagnosis not present

## 2020-11-08 DIAGNOSIS — R399 Unspecified symptoms and signs involving the genitourinary system: Secondary | ICD-10-CM

## 2020-11-08 LAB — URINALYSIS, COMPLETE
Bilirubin, UA: NEGATIVE
Glucose, UA: NEGATIVE
Ketones, UA: NEGATIVE
Leukocytes,UA: NEGATIVE
Nitrite, UA: NEGATIVE
Protein,UA: NEGATIVE
RBC, UA: NEGATIVE
Specific Gravity, UA: 1.015 (ref 1.005–1.030)
Urobilinogen, Ur: 0.2 mg/dL (ref 0.2–1.0)
pH, UA: 7 (ref 5.0–7.5)

## 2020-11-08 LAB — MICROSCOPIC EXAMINATION: Bacteria, UA: NONE SEEN

## 2020-11-10 NOTE — Progress Notes (Signed)
11/10/20  Chief Complaint  Patient presents with  . Cysto     HPI: 80 year old male with refractory urinary symptoms related to BPH who presents today to the office for cystoscopy and prostate sizing.  Refer to my previous note 10/12/2020   Blood pressure (!) 174/92, pulse 73, height 5\' 9"  (1.753 m), weight 180 lb (81.6 kg). NED. A&Ox3.   No respiratory distress   Abd soft, NT, ND Normal phallus with bilateral descended testicles    Cystoscopy Procedure Note  Patient identification was confirmed, informed consent was obtained, and patient was prepped using Betadine solution.  Lidocaine jelly was administered per urethral meatus.   Pre-Procedure: - Inspection reveals a normal caliber urethral meatus.  Procedure: The flexible cystoscope was introduced without difficulty - No urethral strictures/lesions are present. - Short prostatic urethra with nonocclusive prostate  - Normal bladder neck - Bilateral ureteral orifices identified - Bladder mucosa  reveals no ulcers, tumors, or lesions - No bladder stones -Moderate trabeculation with cellules  Retroflexion shows no abnormalities   Post-Procedure: - Patient tolerated the procedure well  Assessment/ Plan:  No significant lateral lobe enlargement   Prostate transrectal ultrasound sizing   Informed consent was obtained after discussing risks/benefits of the procedure.  A time out was performed to ensure correct patient identity.   Pre-Procedure: -Transrectal probe was placed without difficulty -Transrectal Ultrasound performed revealing a  24 gm prostate measuring 4.47 x 2.76 x 3.79 cm (length) -No significant hypoechoic or median lobe noted      Assessment/ Plan:  Small prostate with no significant lateral lobe enlargement  Recommend scheduling urodynamic study   Abbie Sons, MD

## 2020-11-11 ENCOUNTER — Encounter: Payer: Self-pay | Admitting: Urology

## 2020-11-12 ENCOUNTER — Encounter: Payer: Self-pay | Admitting: Urology

## 2020-11-13 ENCOUNTER — Other Ambulatory Visit: Payer: Self-pay

## 2020-11-13 ENCOUNTER — Ambulatory Visit (INDEPENDENT_AMBULATORY_CARE_PROVIDER_SITE_OTHER): Payer: Medicare Other | Admitting: Urology

## 2020-11-13 ENCOUNTER — Encounter: Payer: Self-pay | Admitting: Urology

## 2020-11-13 VITALS — BP 138/74 | HR 82 | Ht 72.0 in | Wt 180.0 lb

## 2020-11-13 DIAGNOSIS — R339 Retention of urine, unspecified: Secondary | ICD-10-CM | POA: Diagnosis not present

## 2020-11-13 DIAGNOSIS — N390 Urinary tract infection, site not specified: Secondary | ICD-10-CM | POA: Diagnosis not present

## 2020-11-13 DIAGNOSIS — R300 Dysuria: Secondary | ICD-10-CM

## 2020-11-13 LAB — BLADDER SCAN AMB NON-IMAGING: Scan Result: 596

## 2020-11-13 LAB — URINALYSIS, COMPLETE
Bilirubin, UA: NEGATIVE
Glucose, UA: NEGATIVE
Ketones, UA: NEGATIVE
Nitrite, UA: NEGATIVE
Protein,UA: NEGATIVE
Specific Gravity, UA: 1.01 (ref 1.005–1.030)
Urobilinogen, Ur: 0.2 mg/dL (ref 0.2–1.0)
pH, UA: 6.5 (ref 5.0–7.5)

## 2020-11-13 LAB — MICROSCOPIC EXAMINATION: WBC, UA: >30 /hpf — AB (ref 0–5)

## 2020-11-13 MED ORDER — CEFUROXIME AXETIL 250 MG PO TABS: 250.0000 mg | ORAL_TABLET | Freq: Two times a day (BID) | ORAL | 0 refills | Status: DC

## 2020-11-13 NOTE — Progress Notes (Signed)
Simple Catheter Placement  Due to urinary retention patient is present today for a foley cath placement.  Patient was cleaned and prepped in a sterile fashion with betadine. A 16 coudle FR foley catheter was inserted, urine return was noted 575 ml, urine was yellow  in color.  The balloon was filled with 10cc of sterile water.  A night bag was attached for drainage. Patient was also given a night bag to take home and was given instruction on how to change from one bag to another.  Patient was given instruction on proper catheter care.  Patient tolerated well, no complications were noted   Performed by: Gaspar Cola CMA  Additional notes/ Follow up: 11/19/2020 wirth sam

## 2020-11-15 ENCOUNTER — Encounter: Payer: Self-pay | Admitting: Urology

## 2020-11-15 NOTE — Progress Notes (Signed)
11/13/2020 7:36 AM   Benjamin Deleon 1940/10/27 [id number]  Referring provider: Derinda Late, MD [phone] [facility] [place],  [place] [address]  Chief Complaint  Patient presents with  . Urinary Incontinence    HPI: 80 y.o. male called for an acute visit for worsening voiding symptoms and dysuria.   Follow-up for lower urinary tract symptoms refractory to tamsulosin/finasteride  PVR on initial eval 455 mL  Cystoscopy performed last week showed a short prostatic urethra and a nonocclusive prostate  Urodynamic study was recommended and pending  He presents today with a several day history of worsening frequency, weak stream, sensation of incomplete emptying and dysuria  Denies fever, chills   PMH: Past Medical History:  Diagnosis Date  . Arthritis   . 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 Mild atypia, limited margins free.  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 deltoid. Mild atypia, limited margins free.  Marland Kitchen GERD (gastroesophageal reflux disease)   . Polymyalgia rheumatica ([place])   . Sleep apnea    uses dental appliance  . Wears hearing aid in both ears     Surgical History: Past Surgical History:  Procedure Laterality Date  . CATARACT EXTRACTION W/PHACO Right 02/22/2020   Procedure: CATARACT EXTRACTION PHACO AND INTRAOCULAR LENS PLACEMENT (IOC) RIGHT 14.63 01:24.4 17.3%;  Surgeon: Leandrew Koyanagi, MD;  Location: [facility];  Service: Ophthalmology;  Laterality: Right;  sleep apnea  . CATARACT EXTRACTION W/PHACO Left 03/21/2020   Procedure: CATARACT EXTRACTION PHACO AND INTRAOCULAR LENS PLACEMENT (IOC) LEFT 8.85  01:05.1  13.6%;  Surgeon: Leandrew Koyanagi, MD;  Location: [place];  Service: Ophthalmology;  Laterality: Left;  . COLONOSCOPY      Home Medications:  Allergies as of 11/13/2020      Reactions   Lactose Other (See Comments)   Gas, bloating   Lactose  Intolerance (gi) Diarrhea   Bloating    Wound Dressing Adhesive Itching   Latex Itching, Rash      Medication List       Accurate as of November 13, 2020 11:59 PM. If you have any questions, ask your nurse or doctor.        cefUROXime 250 MG tablet Commonly known as: CEFTIN Take 1 tablet (250 mg total) by mouth in the morning and at bedtime for 7 days. Started by: Abbie Sons, MD   famotidine 10 MG tablet Commonly known as: PEPCID Take 5 mg by mouth 2 (two) times daily.   finasteride 5 MG tablet Commonly known as: PROSCAR Take 5 mg by mouth daily.   folic acid 1 MG tablet Commonly known as: FOLVITE Take 1 mg by mouth daily.   methotrexate 5 MG tablet Commonly known as: RHEUMATREX Take 5 mg by mouth once a week. Caution: Chemotherapy. Protect from light.   mupirocin ointment 2 % Commonly known as: BACTROBAN Apply 1 application topically daily. Qd to excision site   PREDNISONE PO Take 1.5 mg by mouth daily.   simvastatin 5 MG tablet Commonly known as: ZOCOR Take 5 mg by mouth daily.   tamsulosin 0.4 MG Caps capsule Commonly known as: FLOMAX Take 0.8 mg by mouth daily.   Vitamin D3 50 MCG (2000 UT) Tabs Take by mouth daily.       Allergies:  Allergies  Allergen Reactions  . Lactose Other (See Comments)    Gas, bloating  . Lactose Intolerance (Gi) Diarrhea    Bloating   .  Wound Dressing Adhesive Itching  . Latex Itching and Rash    Family History: No family history on file.  Social History:  reports that he has never smoked. He has never used smokeless tobacco. He reports current alcohol use. No history on file for drug use.   Physical Exam: BP 138/74   Pulse 82   Ht 6' (1.829 m)   Wt 180 lb (81.6 kg)   BMI 24.41 kg/m   Constitutional:  Alert and oriented, No acute distress. HEENT: [place] AT, moist mucus membranes.  Trachea midline, no masses. Cardiovascular: No clubbing, cyanosis, or edema. Respiratory: Normal respiratory effort, no  increased work of breathing. Psychiatric: Normal mood and affect.  Laboratory Data:  Urinalysis Dipstick 2+ leukocytes/1+ RBC Microscopy >30 WBC, many bacteria  Assessment & Plan:    1. Incomplete bladder emptying  Bladder scan PVR 596  Recommended Foley catheter placement for approximately 1 week  Catheter removal early next week and if persistently elevated residuals start intermittent catheterization pending urodynamic study  2.  Dysuria  Significant pyuria consistent with infection  Urine culture ordered  Cefuroxime 250 mg twice daily pending culture result   Abbie Sons, MD  [facility] [address], [place], [place] [address] [phone]

## 2020-11-16 ENCOUNTER — Telehealth: Payer: Self-pay | Admitting: *Deleted

## 2020-11-16 ENCOUNTER — Other Ambulatory Visit: Payer: Self-pay | Admitting: Urology

## 2020-11-16 LAB — CULTURE, URINE COMPREHENSIVE

## 2020-11-16 MED ORDER — DOXYCYCLINE HYCLATE 100 MG PO CAPS: 100.0000 mg | ORAL_CAPSULE | Freq: Two times a day (BID) | ORAL | 0 refills | Status: DC

## 2020-11-16 NOTE — Telephone Encounter (Signed)
Notified patient as instructed, patient pleased., patient agrees

## 2020-11-16 NOTE — Telephone Encounter (Signed)
-----   Message from Abbie Sons, MD sent at 11/16/2020  7:34 AM EST ----- Urine culture grew staph but it looks like it was resistant to the cefuroxime.  Have him stop that antibiotic.  I sent in an Rx doxycycline to his pharmacy

## 2020-11-19 ENCOUNTER — Ambulatory Visit (INDEPENDENT_AMBULATORY_CARE_PROVIDER_SITE_OTHER): Payer: Medicare Other | Admitting: Physician Assistant

## 2020-11-19 ENCOUNTER — Ambulatory Visit: Payer: Medicare Other | Admitting: Physician Assistant

## 2020-11-19 ENCOUNTER — Other Ambulatory Visit: Payer: Self-pay

## 2020-11-19 DIAGNOSIS — R339 Retention of urine, unspecified: Secondary | ICD-10-CM

## 2020-11-19 LAB — BLADDER SCAN AMB NON-IMAGING: Scan Result: 585

## 2020-11-19 NOTE — Progress Notes (Signed)
11/19/2020 9:35 AM   Boris Sharper [birth year] [id number]  CC: Chief Complaint  Patient presents with  . Urinary Incontinence   HPI: Benjamin Deleon is a 80 y.o. male with BPH with incomplete bladder emptying with residuals around 455 mL on Flomax and finasteride with a recent history of symptomatic urinary retention associated with UTI who presents today for voiding trial.  He underwent cystoscopy and TRUS with Dr. Bernardo Heater on 11/08/2020 with findings of a short prostatic urethra with nonocclusive prostate and a 24 g gland.  He was recommended to undergo urodynamics for further evaluation of his symptoms.  He returned to clinic 5 days later with acute worsening of his voiding symptoms and dysuria and was found to be in urinary retention with bladder scan 575mL.  Foley catheter was placed at that time.  Urine culture from his last clinic visit grew oxacillin and penicillin resistant staph epidermidis.  He had originally been put on cefuroxime but this was switched to doxycycline 3 days ago.  Today he reports catheter discomfort.  He is taking antibiotics as prescribed.  He continues to take Flomax and finasteride.  Patient returned to clinic this afternoon for repeat PVR.  He reports drinking 48 ounces of fluid.  He has voided.  PVR 560mL without discomfort.  He declines Foley catheter replacement and CIC teaching.  Urodynamics scheduled for later this month.  PMH: Past Medical History:  Diagnosis Date  . Arthritis   . 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 Mild atypia, limited margins free.  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 deltoid. Mild atypia, limited margins free.  Marland Kitchen GERD (gastroesophageal reflux disease)   . Polymyalgia rheumatica ([place])   . Sleep apnea    uses dental appliance  . Wears hearing aid in both ears     Surgical History: Past Surgical History:  Procedure Laterality Date  . CATARACT EXTRACTION W/PHACO Right 02/22/2020   Procedure:  CATARACT EXTRACTION PHACO AND INTRAOCULAR LENS PLACEMENT (IOC) RIGHT 14.63 01:24.4 17.3%;  Surgeon: Leandrew Koyanagi, MD;  Location: [facility];  Service: Ophthalmology;  Laterality: Right;  sleep apnea  . CATARACT EXTRACTION W/PHACO Left 03/21/2020   Procedure: CATARACT EXTRACTION PHACO AND INTRAOCULAR LENS PLACEMENT (IOC) LEFT 8.85  01:05.1  13.6%;  Surgeon: Leandrew Koyanagi, MD;  Location: [facility];  Service: Ophthalmology;  Laterality: Left;  . COLONOSCOPY      Home Medications:  Allergies as of 11/19/2020      Reactions   Lactose Other (See Comments)   Gas, bloating   Lactose Intolerance (gi) Diarrhea   Bloating    Wound Dressing Adhesive Itching   Latex Itching, Rash      Medication List       Accurate as of November 19, 2020  9:35 AM. If you have any questions, ask your nurse or doctor.        doxycycline 100 MG capsule Commonly known as: VIBRAMYCIN Take 1 capsule (100 mg total) by mouth every 12 (twelve) hours.   famotidine 10 MG tablet Commonly known as: PEPCID Take 5 mg by mouth 2 (two) times daily.   finasteride 5 MG tablet Commonly known as: PROSCAR Take 5 mg by mouth daily.   folic acid 1 MG tablet Commonly known as: FOLVITE Take 1 mg by mouth daily.   methotrexate 5 MG tablet Commonly known as: RHEUMATREX Take 5 mg by mouth once a week. Caution: Chemotherapy. Protect from light.   mupirocin ointment 2 % Commonly known as:  BACTROBAN Apply 1 application topically daily. Qd to excision site   PREDNISONE PO Take 1.5 mg by mouth daily.   simvastatin 5 MG tablet Commonly known as: ZOCOR Take 5 mg by mouth daily.   tamsulosin 0.4 MG Caps capsule Commonly known as: FLOMAX Take 0.8 mg by mouth daily.   Vitamin D3 50 MCG (2000 UT) Tabs Take by mouth daily.       Allergies:  Allergies  Allergen Reactions  . Lactose Other (See Comments)    Gas, bloating  . Lactose Intolerance (Gi) Diarrhea    Bloating   . Wound  Dressing Adhesive Itching  . Latex Itching and Rash    Family History: No family history on file.  Social History:   reports that he has never smoked. He has never used smokeless tobacco. He reports current alcohol use. No history on file for drug use.  Physical Exam: There were no vitals taken for this visit.  Constitutional:  Alert and oriented, no acute distress, nontoxic appearing HEENT: [place], AT Cardiovascular: No clubbing, cyanosis, or edema Respiratory: Normal respiratory effort, no increased work of breathing GU: Circumcised phallus. Erythema and edema of the glans penis. Skin: No rashes, bruises or suspicious lesions Neurologic: Grossly intact, no focal deficits, moving all 4 extremities Psychiatric: Normal mood and affect  Laboratory Data: Results for orders placed or performed in visit on 11/19/20  BLADDER SCAN AMB NON-IMAGING  Result Value Ref Range   Scan Result 585    Assessment & Plan:   1. Incomplete bladder emptying Patient continues to have incomplete bladder emptying/chronic urinary retention upon voiding trial today.  He declines Foley catheter replacement and CIC teaching pending urodynamics results.  We discussed that the risks of urinary retention include hydronephrosis, AKI, and renal failure, though urinary retention does not appear to be a new problem for this patient and creatinine has been stable.  I encouraged him to start timed voiding at least every 2 hours during the day.  He is in agreement with this plan.  We discussed return precautions today including lower abdominal pain and the inability to void or frequent, small volume voids.  Patient expressed understanding. - BLADDER SCAN AMB NON-IMAGING   Return if symptoms worsen or fail to improve.  Debroah Loop, PA-C  [facility] [address] [place], [place] [address] [phone]

## 2020-11-19 NOTE — Progress Notes (Signed)
Catheter Removal  Patient is present today for a catheter removal.  9ml of water was drained from the balloon. A 16FR coude foley cath was removed from the bladder no complications were noted . Patient tolerated well.  Performed by: Samantha Vaillancourt, PA-C   Follow up/ Additional notes: Push fluids and RTC this afternoon for PVR. 

## 2020-11-20 ENCOUNTER — Ambulatory Visit (INDEPENDENT_AMBULATORY_CARE_PROVIDER_SITE_OTHER): Payer: Medicare Other | Admitting: Physician Assistant

## 2020-11-20 DIAGNOSIS — R339 Retention of urine, unspecified: Secondary | ICD-10-CM | POA: Diagnosis not present

## 2020-11-20 NOTE — Patient Instructions (Signed)

## 2020-11-20 NOTE — Progress Notes (Signed)
Continuous Intermittent Catheterization  Due to chronic retention patient is present today for a teaching of self I & O Catheterization. Patient was given detailed verbal and printed instructions of self catheterization. Patient was cleaned and prepped in a clean fashion.  With instruction and assistance patient inserted a 16FR Coloplast SpeediCath Standard and urine return was noted 525 ml, urine was yellow in color. Patient tolerated well, no complications were noted Patient was given a sample bag with supplies to take home.  Instructions were given for patient to cath PRN.  Will defer Coloplast order per UDS results per patient preference.   Preformed by: Debroah Loop, PA-C   Additional Notes: Return if symptoms worsen or fail to improve.

## 2020-11-22 ENCOUNTER — Telehealth: Payer: Self-pay | Admitting: Physician Assistant

## 2020-11-22 NOTE — Telephone Encounter (Signed)
Pt states he has a question about self catherizing and needs more supplies. Please advise.

## 2020-11-22 NOTE — Telephone Encounter (Signed)
Patient is requesting more catheters for over the weekend. He states that Sam gave him 60 and he has already used 3 and doesn't want to take the risk of running out over the weekend. Per Sam ok to give more catheters. Patient notified.

## 2020-11-26 ENCOUNTER — Telehealth: Payer: Self-pay

## 2020-11-26 NOTE — Telephone Encounter (Signed)
Incoming call from patient in regards to needing more catheters. Patient was given extra catheters on Friday 11/23/20 and is now out. Patient originally deferred Coloplast until urodynamics was completed however Sam would like to go ahead and initiate a Coloplast order so patient can have the catheters he needs. Coloplast order sent. LMOM to make patient aware and instructed him to call office back should he need more catheters till his Coloplast order arrives.

## 2020-11-27 ENCOUNTER — Telehealth: Payer: Self-pay | Admitting: *Deleted

## 2020-11-27 ENCOUNTER — Other Ambulatory Visit: Payer: Self-pay | Admitting: Urology

## 2020-11-27 ENCOUNTER — Telehealth: Payer: Self-pay | Admitting: Urology

## 2020-11-27 NOTE — Telephone Encounter (Signed)
Urodynamic study was reviewed and he does not generate a bladder contraction meaning the bladder is unable to empty due to muscle weakness.  Unfortunately there is not a surgical procedure to get him to void spontaneously and intermittent catheterization is his best option.  I will review his study with Dr. Matilde Sprang in 2 weeks just to make sure he does not have any other recommendations

## 2020-11-27 NOTE — Telephone Encounter (Signed)
Coud catheters are designed to assist with catheterization in patients with significantly enlarged prostates.  His prostate is only minimally enlarged and he is catheterizing easily with a standard straight tip catheter.  Please explain to the patient that there is no indication to switch to a coud catheter, and insurance companies typically require documentation that this is a necessity, so he would not meet this standard.

## 2020-11-27 NOTE — Telephone Encounter (Signed)
Notified patient as advised, he expressed understanding. Patient reports having his urodynamics study done this morning. He would like to know will his results be discussed over a phone call or should he have a results appt? Please advise.

## 2020-11-27 NOTE — Telephone Encounter (Signed)
Patient called today and states he has been looking up information on the coude catheters and would like to try them .

## 2020-11-28 NOTE — Telephone Encounter (Signed)
Notified patient as instructed.   

## 2020-11-30 ENCOUNTER — Other Ambulatory Visit: Payer: Self-pay

## 2020-11-30 ENCOUNTER — Telehealth (INDEPENDENT_AMBULATORY_CARE_PROVIDER_SITE_OTHER): Payer: Medicare Other | Admitting: Physician Assistant

## 2020-11-30 DIAGNOSIS — R339 Retention of urine, unspecified: Secondary | ICD-10-CM

## 2020-11-30 DIAGNOSIS — R351 Nocturia: Secondary | ICD-10-CM

## 2020-11-30 NOTE — Progress Notes (Signed)
Virtual Visit via Telephone Note  I connected with Benjamin Deleon on 11/30/20 at  9:00 AM EST by telephone and verified that I am speaking with the correct person using two identifiers.  Location: Patient: Home in [place], [place] Provider: Clinic in [place], [place]   I discussed the limitations, risks, security and privacy concerns of performing an evaluation and management service by telephone and the availability of in person appointments. I also discussed with the patient that there may be a patient responsible charge related to this service. The patient expressed understanding and agreed to proceed.  History of Present Illness: 80 year old male with BPH with incomplete bladder emptying with residuals around 455 mL on Flomax and finasteride with a recent history of symptomatic urinary retention associated with UTI who presents today to discuss self-catheterization.  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  Today he report he has been self cathing 4-5 times daily.  He does void sometimes at home, but states his bladder has to be quite full in order to generate a contraction.  He has noticed a small volume of blood at the beginning of bladder drainage after catheter insertion.  He had previously been instructed on the use of 16Fr [employer] SpeediCath Standard catheters, but notes these are too stiff and he has had difficulty advancing these into the bladder. [employer] sent him [employer] 12/18Fr catheters as an alternative, and he much prefers these due to the more flexible tubing and smaller diameter.  Additionally, he is curious about coud catheters.  On recent TRUS, he was noted to have a 24 g prostate.  It was nonobstructive on cystoscopy.  Patient reports some nocturia. He has a history of OSA and wears a dental appliance to sleep, not on CPAP.  Observations/Objective: Asking appropriate questions, no apparent distress.  Assessment and Plan: 1. Incomplete  bladder emptying Despite urodynamics findings of an a contractile bladder, patient does occasionally void spontaneously.  Counseled patient to continue self-catheterization 4-5 times daily.  Counseled him to increase frequency of catheterization if he begins to consistently drain greater than 350 mL from his bladder.  Patient expressed understanding.  I explained to the patient that insurance will not cover closed catheter systems, as in the case of the SpeediCath compact if he does not have a history of recurrent UTI, which he does not.  Additionally, I explained that he does not require a coud catheter due to his small, nonobstructive prostate and that typically insurance companies require documentation to justify the use of coud catheters.  Okay to order smaller caliber catheters as they are easier for him to pass.  2. Nocturia Counseled the patient that OSA causes hormonal changes during sleep that increased urine production.  He does not wish to pursue CPAP at this time.  I counseled him to reduce fluid intake after dinner and make sure to self cath prior to bed.  He expressed understanding.  Follow Up Instructions: Dr. Bernardo Heater to discuss this case with Dr. Matilde Sprang and follow-up with the patient directly regarding alternative therapies.   I discussed the assessment and treatment plan with the patient. The patient was provided an opportunity to ask questions and all were answered. The patient agreed with the plan and demonstrated an understanding of the instructions.   The patient was advised to call back or seek an in-person evaluation if the symptoms worsen or if the condition fails to improve as anticipated.  I provided 14 minutes of non-face-to-face time during this encounter.  Debroah Loop,  PA-C

## 2020-11-30 NOTE — Progress Notes (Signed)
This service is provided via telemedicine   No vital signs collected/recorded due to the encounter was a telemedicine visit.     Patient consents to a telephone visit:  Yes    Names of all persons participating in the telemedicine service and their role in the encounter:  Brecken Walth O`Sullivan, RMA   

## 2020-12-06 ENCOUNTER — Other Ambulatory Visit: Payer: Self-pay

## 2020-12-06 ENCOUNTER — Ambulatory Visit (INDEPENDENT_AMBULATORY_CARE_PROVIDER_SITE_OTHER): Payer: Medicare Other | Admitting: Podiatry

## 2020-12-06 ENCOUNTER — Encounter: Payer: Self-pay | Admitting: Podiatry

## 2020-12-06 DIAGNOSIS — B351 Tinea unguium: Secondary | ICD-10-CM

## 2020-12-11 NOTE — Progress Notes (Signed)
Error. Patient left without being seen. GAM

## 2020-12-12 ENCOUNTER — Other Ambulatory Visit: Payer: Self-pay

## 2020-12-12 ENCOUNTER — Ambulatory Visit (INDEPENDENT_AMBULATORY_CARE_PROVIDER_SITE_OTHER): Payer: Medicare Other | Admitting: Physician Assistant

## 2020-12-12 ENCOUNTER — Encounter: Payer: Self-pay | Admitting: Physician Assistant

## 2020-12-12 VITALS — BP 118/79 | HR 65 | Ht 72.0 in | Wt 171.0 lb

## 2020-12-12 DIAGNOSIS — R339 Retention of urine, unspecified: Secondary | ICD-10-CM | POA: Diagnosis not present

## 2020-12-12 DIAGNOSIS — R350 Frequency of micturition: Secondary | ICD-10-CM

## 2020-12-12 NOTE — Progress Notes (Signed)
12/12/2020 2:07 PM   Benjamin Deleon 1940/11/04 [id number]  CC: Chief Complaint  Patient presents with  . Urinary Tract Infection    HPI: Benjamin Deleon is a 80 y.o. 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  Today he reports urinary frequency over the past several days concerning for possible infection.  He has also been quite constipated and is treating his constipation with milk of magnesia and increased fluid intake.    Additionally, he reports self-catheterization 7 times daily, approximately every 4 hours, with 400 to 600 mL of urinary output each time.  He wonders if this is an appropriate frequency of self-catheterization.  He states he is capable of spontaneously voiding, but only when his bladder is rather full.  It sounds that he is primarily relying on self-catheterization to empty his bladder.  He requests an adjustment with his [employer] order to give him sufficient catheters to self cath at least 7 times daily.  In-office UA today positive for 2+ blood; urine microscopy with 3-10 RBCs/HPF.  PMH: Past Medical History:  Diagnosis Date  . Arthritis   . 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 Mild atypia, limited margins free.  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 deltoid. Mild atypia, limited margins free.  Marland Kitchen GERD (gastroesophageal reflux disease)   . Polymyalgia rheumatica ([place])   . Sleep apnea    uses dental appliance  . Wears hearing aid in both ears     Surgical History: Past Surgical History:  Procedure Laterality Date  . CATARACT EXTRACTION W/PHACO Right 02/22/2020   Procedure: CATARACT EXTRACTION PHACO AND INTRAOCULAR LENS PLACEMENT (IOC) RIGHT 14.63 01:24.4 17.3%;  Surgeon: Leandrew Koyanagi, MD;  Location: [facility];  Service: Ophthalmology;  Laterality: Right;  sleep apnea  . CATARACT EXTRACTION W/PHACO Left 03/21/2020   Procedure: CATARACT EXTRACTION PHACO  AND INTRAOCULAR LENS PLACEMENT (IOC) LEFT 8.85  01:05.1  13.6%;  Surgeon: Leandrew Koyanagi, MD;  Location: [facility];  Service: Ophthalmology;  Laterality: Left;  . COLONOSCOPY      Home Medications:  Allergies as of 12/12/2020      Reactions   Lactose Other (See Comments)   Gas, bloating   Lactose Intolerance (gi) Diarrhea   Bloating    Wound Dressing Adhesive Itching      Medication List       Accurate as of December 12, 2020  2:07 PM. If you have any questions, ask your nurse or doctor.        famotidine 10 MG tablet Commonly known as: PEPCID Take 5 mg by mouth 2 (two) times daily.   finasteride 5 MG tablet Commonly known as: PROSCAR Take 5 mg by mouth daily.   folic acid 1 MG tablet Commonly known as: FOLVITE Take 1 mg by mouth daily.   methotrexate 5 MG tablet Commonly known as: RHEUMATREX Take 5 mg by mouth once a week. Caution: Chemotherapy. Protect from light.   methotrexate 2.5 MG tablet Take by mouth.   mupirocin ointment 2 % Commonly known as: BACTROBAN Apply 1 application topically daily. Qd to excision site   predniSONE 1 MG tablet Commonly known as: DELTASONE Take by mouth.   simvastatin 5 MG tablet Commonly known as: ZOCOR Take 5 mg by mouth daily.   tamsulosin 0.4 MG Caps capsule Commonly known as: FLOMAX Take 0.8 mg by mouth daily.   Vitamin D3 50 MCG (2000 UT) Tabs Take by mouth daily.  Allergies:  Allergies  Allergen Reactions  . Lactose Other (See Comments)    Gas, bloating  . Lactose Intolerance (Gi) Diarrhea    Bloating   . Wound Dressing Adhesive Itching    Family History: Family History  Problem Relation Age of Onset  . Bladder Cancer Father     Social History:   reports that he has never smoked. He has never used smokeless tobacco. He reports current alcohol use. No history on file for drug use.  Physical Exam: BP 118/79   Pulse 65   Ht 6' (1.829 m)   Wt 171 lb (77.6 kg)   BMI 23.19 kg/m    Constitutional:  Alert and oriented, no acute distress, nontoxic appearing HEENT: [place], AT Cardiovascular: No clubbing, cyanosis, or edema Respiratory: Normal respiratory effort, no increased work of breathing Skin: No rashes, bruises or suspicious lesions Neurologic: Grossly intact, no focal deficits, moving all 4 extremities Psychiatric: Normal mood and affect  Laboratory Data: Results for orders placed or performed in visit on 12/12/20  Microscopic Examination   Urine  Result Value Ref Range   WBC, UA 0-5 0 - 5 /hpf   RBC 3-10 (A) 0 - 2 /hpf   Epithelial Cells (non renal) 0-10 0 - 10 /hpf   Bacteria, UA None seen None seen/Few  Urinalysis, Complete  Result Value Ref Range   Specific Gravity, UA 1.010 1.005 - 1.030   pH, UA 6.5 5.0 - 7.5   Color, UA Yellow Yellow   Appearance Ur Clear Clear   Leukocytes,UA Negative Negative   Protein,UA Negative Negative/Trace   Glucose, UA Negative Negative   Ketones, UA Negative Negative   RBC, UA 2+ (A) Negative   Bilirubin, UA Negative Negative   Urobilinogen, Ur 0.2 0.2 - 1.0 mg/dL   Nitrite, UA Negative Negative   Microscopic Examination See below:    Assessment & Plan:   1. Urinary frequency UA benign today, mild microscopic hematuria consistent with self-catheterization.  I explained that I suspect his urinary frequency is likely associated with his constipation and increased fluid intake.  Counseled him to switch from milk of magnesia to MiraLAX as a nonstimulant laxative alternative.  Patient expressed understanding. - Urinalysis, Complete  2. Incomplete bladder emptying Counseled patient that 7 times daily is an appropriate frequency of self-catheterization, especially given his urinary output each time.  I will adjust his [employer] order to allow him sufficient supplies to self cath up to 8 times daily per patient request.  Return if symptoms worsen or fail to improve.  Debroah Loop, PA-C  [facility] [address] [place], [place] [address] [phone]

## 2020-12-14 ENCOUNTER — Telehealth: Payer: Self-pay

## 2020-12-14 LAB — URINALYSIS, COMPLETE
Bilirubin, UA: NEGATIVE
Glucose, UA: NEGATIVE
Ketones, UA: NEGATIVE
Leukocytes,UA: NEGATIVE
Nitrite, UA: NEGATIVE
Protein,UA: NEGATIVE
Specific Gravity, UA: 1.01 (ref 1.005–1.030)
Urobilinogen, Ur: 0.2 mg/dL (ref 0.2–1.0)
pH, UA: 6.5 (ref 5.0–7.5)

## 2020-12-14 LAB — MICROSCOPIC EXAMINATION: Bacteria, UA: NONE SEEN

## 2020-12-14 NOTE — Telephone Encounter (Signed)
Incoming message left on triage line from [place] at [employer] requesting new RX for catheters written for 8 times daily. Please advise.

## 2020-12-14 NOTE — Telephone Encounter (Signed)
New catheter RX faxed along with last office visit note increasing patients catheter frequency to 8x daily.

## 2020-12-18 NOTE — Telephone Encounter (Signed)
New catheter supply order submitted 12/18/20.

## 2020-12-23 ENCOUNTER — Telehealth: Payer: Self-pay | Admitting: Urology

## 2020-12-23 NOTE — Telephone Encounter (Signed)
Please let patient know I discussed his as directed study with Dr. Matilde Sprang and unfortunately the only option is going to be intermittent catheterization, a chronic indwelling catheter or suprapubic tube.    Of those options intermittent catheterization would be the recommended choice.

## 2020-12-24 ENCOUNTER — Encounter: Payer: Self-pay | Admitting: *Deleted

## 2021-01-03 ENCOUNTER — Ambulatory Visit: Payer: Medicare Other | Admitting: Podiatry

## 2021-01-04 ENCOUNTER — Ambulatory Visit: Payer: Medicare Other | Admitting: Physician Assistant

## 2021-01-04 ENCOUNTER — Telehealth: Payer: Self-pay

## 2021-01-04 NOTE — Telephone Encounter (Signed)
ERROR

## 2021-01-07 ENCOUNTER — Ambulatory Visit (INDEPENDENT_AMBULATORY_CARE_PROVIDER_SITE_OTHER): Payer: Medicare Other | Admitting: Podiatry

## 2021-01-07 ENCOUNTER — Encounter: Payer: Self-pay | Admitting: Podiatry

## 2021-01-07 ENCOUNTER — Other Ambulatory Visit: Payer: Self-pay

## 2021-01-07 DIAGNOSIS — B351 Tinea unguium: Secondary | ICD-10-CM | POA: Diagnosis not present

## 2021-01-07 DIAGNOSIS — L600 Ingrowing nail: Secondary | ICD-10-CM

## 2021-01-07 NOTE — Progress Notes (Signed)
This patient returns  to the office concerned with pain in his left big toe.  He says he has intermittant pain in both great toes with his left great toe greater than his right toe. He presents to the office for evaluation and treatment.  Vascular  Dorsalis pedis and posterior tibial pulses are palpable  B/L.  Capillary return  WNL.  Temperature gradient is  WNL.  Skin turgor  WNL  Sensorium  Senn Weinstein monofilament wire  WNL. Normal tactile sensation.  Nail Exam  Patient has normal nails with no evidence of bacterial .    Thick hallux nails with incurvation noted both borders both great toes.  Orthopedic  Exam  Muscle tone and muscle strength  WNL.  No limitations of motion feet  B/L.  No crepitus or joint effusion noted.  Foot type is unremarkable and digits show no abnormalities.  Bony prominences are unremarkable.  Skin  No open lesions.  Normal skin texture and turgor.  Ingrown nails Hallux  B/L.  Onychomycosis  Hallux  B/L.    Debrided hallux nails with nail nipper followed by dremel tool.  .  Discussed condition with patient.  RTC 10 weeks   Gardiner Barefoot DPM

## 2021-02-14 ENCOUNTER — Ambulatory Visit: Payer: Medicare Other | Admitting: Podiatry

## 2021-02-20 ENCOUNTER — Encounter: Payer: Self-pay | Admitting: Physician Assistant

## 2021-02-20 ENCOUNTER — Other Ambulatory Visit: Payer: Self-pay

## 2021-02-20 ENCOUNTER — Ambulatory Visit (INDEPENDENT_AMBULATORY_CARE_PROVIDER_SITE_OTHER): Payer: Medicare Other | Admitting: Physician Assistant

## 2021-02-20 VITALS — BP 159/84 | HR 67 | Ht 72.0 in | Wt 175.0 lb

## 2021-02-20 DIAGNOSIS — R339 Retention of urine, unspecified: Secondary | ICD-10-CM

## 2021-02-20 NOTE — Progress Notes (Signed)
02/20/2021 10:06 AM   Benjamin Deleon 20-Apr-1941 [id number]  CC: Chief Complaint  Patient presents with  . Other    HPI: Benjamin Deleon is a 80 y.o. 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  Today he reports he continues to [facility] 7 times daily with nocturia x1-2.  He is no longer measuring his volume, however he previously reported output of 400 to 600 mL with each catheterization.  He has been using the [employer] catheters, however he has since tried the [employer] Straight catheters and prefers these, noting less irritation with insertion.  Patient notes that Medicare only covers 2400 catheters annually, and with his frequency of 7 catheterizations daily, he will require 3220 catheters.  He has been working with [employer] purchase the additional catheters he requires and requests a prescription to authorize this.  Patient stopped Flomax recently and is no longer able to void spontaneously.  He is comfortable relying exclusively on self-catheterization and does not wish to resume Flomax.  Additionally, he brings a form with him today authorizing carriage of catheters while traveling and request a signature.  PMH: Past Medical History:  Diagnosis Date  . Arthritis   . 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 Mild atypia, limited margins free.  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 deltoid. Mild atypia, limited margins free.  Marland Kitchen GERD (gastroesophageal reflux disease)   . Polymyalgia rheumatica ([place])   . Sleep apnea    uses dental appliance  . Wears hearing aid in both ears     Surgical History: Past Surgical History:  Procedure Laterality Date  . CATARACT EXTRACTION W/PHACO Right 02/22/2020   Procedure: CATARACT EXTRACTION PHACO AND INTRAOCULAR LENS PLACEMENT (IOC) RIGHT 14.63 01:24.4 17.3%;  Surgeon: Leandrew Koyanagi, MD;   Location: [facility];  Service: Ophthalmology;  Laterality: Right;  sleep apnea  . CATARACT EXTRACTION W/PHACO Left 03/21/2020   Procedure: CATARACT EXTRACTION PHACO AND INTRAOCULAR LENS PLACEMENT (IOC) LEFT 8.85  01:05.1  13.6%;  Surgeon: Leandrew Koyanagi, MD;  Location: [facility];  Service: Ophthalmology;  Laterality: Left;  . COLONOSCOPY      Home Medications:  Allergies as of 02/20/2021      Reactions   Lactose Other (See Comments)   Gas, bloating   Lactose Intolerance (gi) Diarrhea   Bloating    Wound Dressing Adhesive Itching      Medication List       Accurate as of Feb 20, 2021 10:06 AM. If you have any questions, ask your nurse or doctor.        famotidine 10 MG tablet Commonly known as: PEPCID Take 5 mg by mouth 2 (two) times daily.   finasteride 5 MG tablet Commonly known as: PROSCAR Take 5 mg by mouth daily.   folic acid 1 MG tablet Commonly known as: FOLVITE Take 1 mg by mouth daily.   methotrexate 5 MG tablet Commonly known as: RHEUMATREX Take 5 mg by mouth once a week. Caution: Chemotherapy. Protect from light.   methotrexate 2.5 MG tablet Take by mouth.   mupirocin ointment 2 % Commonly known as: BACTROBAN Apply 1 application topically daily. Qd to excision site   predniSONE 1 MG tablet Commonly known as: DELTASONE Take by mouth.   simvastatin 5 MG tablet Commonly known as: ZOCOR Take 5 mg by mouth daily.   tamsulosin 0.4 MG Caps capsule Commonly known  as: FLOMAX Take 0.8 mg by mouth daily.   Vitamin D3 50 MCG (2000 UT) Tabs Take by mouth daily.       Allergies:  Allergies  Allergen Reactions  . Lactose Other (See Comments)    Gas, bloating  . Lactose Intolerance (Gi) Diarrhea    Bloating   . Wound Dressing Adhesive Itching    Family History: Family History  Problem Relation Age of Onset  . Bladder Cancer Father     Social History:   reports that he has never smoked. He has never used smokeless  tobacco. He reports current alcohol use. No history on file for drug use.  Physical Exam: BP (!) 159/84   Pulse 67   Ht 6' (1.829 m)   Wt 175 lb (79.4 kg)   BMI 23.73 kg/m   Constitutional:  Alert and oriented, no acute distress, nontoxic appearing HEENT: [place], AT Cardiovascular: No clubbing, cyanosis, or edema Respiratory: Normal respiratory effort, no increased work of breathing Skin: No rashes, bruises or suspicious lesions Neurologic: Grossly intact, no focal deficits, moving all 4 extremities Psychiatric: Normal mood and affect  Assessment & Plan:   1. Urinary retention -No longer voiding spontaneously, okay to stay off Flomax -Continue [facility] 7 times daily -New order to [employer] will be submitted today for 12 [place] SpeediCath Standard straight catheters -Letter provided today for medical months authorizing out-of-pocket purchase of additional supplies -Travel form signed today  Return in about 1 year (around 02/20/2022) for Annual chronic retention f/u.  Debroah Loop, PA-C  [facility] [address], [place],  [address] [phone]

## 2021-03-21 ENCOUNTER — Other Ambulatory Visit: Payer: Self-pay

## 2021-03-21 ENCOUNTER — Ambulatory Visit (INDEPENDENT_AMBULATORY_CARE_PROVIDER_SITE_OTHER): Payer: Medicare Other | Admitting: Podiatry

## 2021-03-21 ENCOUNTER — Encounter: Payer: Self-pay | Admitting: Podiatry

## 2021-03-21 DIAGNOSIS — B351 Tinea unguium: Secondary | ICD-10-CM

## 2021-03-21 NOTE — Progress Notes (Signed)
This patient returns  to the office concerned with pain in his left big toe.  He says he has intermittant pain in both great toes with his left great toe greater than his right toe. He presents to the office for evaluation and treatment.  Vascular  Dorsalis pedis and posterior tibial pulses are palpable  B/L.  Capillary return  WNL.  Temperature gradient is  WNL.  Skin turgor  WNL  Sensorium  Senn Weinstein monofilament wire  WNL. Normal tactile sensation.  Nail Exam  Patient has normal nails with no evidence of bacterial .    Thick hallux nails with incurvation noted both borders both great toes.  Orthopedic  Exam  Muscle tone and muscle strength  WNL.  No limitations of motion feet  B/L.  No crepitus or joint effusion noted.  Foot type is unremarkable and digits show no abnormalities.  Bony prominences are unremarkable.  Skin  No open lesions.  Normal skin texture and turgor.  Ingrown nails Hallux  B/L.  Onychomycosis  Hallux  B/L.    Debrided hallux nails with nail nipper followed by dremel tool.  .  Discussed condition with patient.  RTC 10 weeks   Gardiner Barefoot DPM

## 2021-05-30 ENCOUNTER — Ambulatory Visit: Payer: Medicare Other | Admitting: Podiatry

## 2021-06-13 ENCOUNTER — Encounter: Payer: Medicare Other | Admitting: Dermatology

## 2021-06-24 ENCOUNTER — Ambulatory Visit (INDEPENDENT_AMBULATORY_CARE_PROVIDER_SITE_OTHER): Payer: Medicare Other | Admitting: Dermatology

## 2021-06-24 ENCOUNTER — Other Ambulatory Visit: Payer: Self-pay

## 2021-06-24 DIAGNOSIS — L814 Other melanin hyperpigmentation: Secondary | ICD-10-CM

## 2021-06-24 DIAGNOSIS — L570 Actinic keratosis: Secondary | ICD-10-CM | POA: Diagnosis not present

## 2021-06-24 DIAGNOSIS — Z1283 Encounter for screening for malignant neoplasm of skin: Secondary | ICD-10-CM | POA: Diagnosis not present

## 2021-06-24 DIAGNOSIS — L820 Inflamed seborrheic keratosis: Secondary | ICD-10-CM

## 2021-06-24 DIAGNOSIS — B353 Tinea pedis: Secondary | ICD-10-CM | POA: Diagnosis not present

## 2021-06-24 DIAGNOSIS — D225 Melanocytic nevi of trunk: Secondary | ICD-10-CM | POA: Diagnosis not present

## 2021-06-24 DIAGNOSIS — L578 Other skin changes due to chronic exposure to nonionizing radiation: Secondary | ICD-10-CM | POA: Diagnosis not present

## 2021-06-24 DIAGNOSIS — D229 Melanocytic nevi, unspecified: Secondary | ICD-10-CM

## 2021-06-24 DIAGNOSIS — D1800 Hemangioma unspecified site: Secondary | ICD-10-CM

## 2021-06-24 DIAGNOSIS — B351 Tinea unguium: Secondary | ICD-10-CM | POA: Diagnosis not present

## 2021-06-24 DIAGNOSIS — D489 Neoplasm of uncertain behavior, unspecified: Secondary | ICD-10-CM

## 2021-06-24 DIAGNOSIS — L821 Other seborrheic keratosis: Secondary | ICD-10-CM

## 2021-06-24 MED ORDER — KETOCONAZOLE 2 % EX CREA: 1.0000 "application " | TOPICAL_CREAM | Freq: Every day | CUTANEOUS | 11 refills | Status: DC

## 2021-06-24 NOTE — Progress Notes (Signed)
Follow-Up Visit   Subjective  Benjamin Deleon is a 80 y.o. male who presents for the following: Annual Exam (Patient here today for a yearly tbse. Patient would like to discuss dry skin at the end of left eyebrow. ). Patient here for full body skin exam and skin cancer screening.  The following portions of the chart were reviewed this encounter and updated as appropriate:  Tobacco  Allergies  Meds  Problems  Med Hx  Surg Hx  Fam Hx     Review of Systems: No other skin or systemic complaints except as noted in HPI or Assessment and Plan.  Objective  Well appearing patient in no apparent distress; mood and affect are within normal limits.  A full examination was performed including scalp, head, eyes, ears, nose, lips, neck, chest, axillae, abdomen, back, buttocks, bilateral upper extremities, bilateral lower extremities, hands, feet, fingers, toes, fingernails, and toenails. All findings within normal limits unless otherwise noted below.  Left Temple x 1 Erythematous thin papules/macules with gritty scale.   right mid back 3 cm lateral to spine 0.6 cm dark brown macule   bilatera feet and toenails Scaling and maceration web spaces and over distal and lateral soles.   scalp x 1 Erythematous keratotic or waxy stuck-on papule or plaque.   Assessment & Plan  Actinic keratosis Left Temple x 1  Actinic keratoses are precancerous spots that appear secondary to cumulative UV radiation exposure/sun exposure over time. They are chronic with expected duration over 1 year. A portion of actinic keratoses will progress to squamous cell carcinoma of the skin. It is not possible to reliably predict which spots will progress to skin cancer and so treatment is recommended to prevent development of skin cancer.  Recommend daily broad spectrum sunscreen SPF 30+ to sun-exposed areas, reapply every 2 hours as needed.  Recommend staying in the shade or wearing long sleeves, sun glasses (UVA+UVB  protection) and wide brim hats (4-inch brim around the entire circumference of the hat). Call for new or changing lesions.  Destruction of lesion - Left Temple x 1 Complexity: simple   Destruction method: cryotherapy   Informed consent: discussed and consent obtained   Timeout:  patient name, date of birth, surgical site, and procedure verified Lesion destroyed using liquid nitrogen: Yes   Region frozen until ice ball extended beyond lesion: Yes   Outcome: patient tolerated procedure well with no complications   Post-procedure details: wound care instructions given    Neoplasm of uncertain behavior right mid back 3 cm lateral to spine Epidermal / dermal shaving  Lesion diameter (cm):  0.6 Informed consent: discussed and consent obtained   Timeout: patient name, date of birth, surgical site, and procedure verified   Procedure prep:  Patient was prepped and draped in usual sterile fashion Prep type:  Isopropyl alcohol Anesthesia: the lesion was anesthetized in a standard fashion   Anesthetic:  1% lidocaine w/ epinephrine 1-100,000 buffered w/ 8.4% NaHCO3 Instrument used: flexible razor blade   Hemostasis achieved with: pressure, aluminum chloride and electrodesiccation   Outcome: patient tolerated procedure well   Post-procedure details: sterile dressing applied and wound care instructions given   Dressing type: bandage and petrolatum    Specimen 1 - Surgical pathology Differential Diagnosis: r/o nevus vs dysplastic  Check Margins: No R.o nevus vs dysplastic   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 Apply to entire foot, in between toes, side of  foot (apply to both feet) until clear  Inflamed seborrheic keratosis scalp x 1 Destruction of lesion - scalp x 1 Complexity: simple   Destruction method: cryotherapy   Informed consent: discussed and consent obtained   Timeout:  patient name, date of birth, surgical site, and procedure verified Lesion destroyed using liquid nitrogen: Yes   Region frozen until ice ball extended beyond lesion: Yes   Outcome: patient tolerated procedure well with no complications   Post-procedure details: wound care instructions given    Skin cancer screening  Lentigines - Scattered tan macules - Due to sun exposure - Benign-appearing, observe - Recommend daily broad spectrum sunscreen SPF 30+ to sun-exposed areas, reapply every 2 hours as needed. - Call for any changes  Seborrheic Keratoses - Stuck-on, waxy, tan-brown papules and/or plaques  - Benign-appearing - Discussed benign etiology and prognosis. - Observe - Call for any changes  Melanocytic Nevi - Tan-brown and/or pink-flesh-colored symmetric macules and papules - Benign appearing on exam today - Observation - Call clinic for new or changing moles - Recommend daily use of broad spectrum spf 30+ sunscreen to sun-exposed areas.   Hemangiomas - Red papules - Discussed benign nature - Observe - Call for any changes  Actinic Damage - Chronic condition, secondary to cumulative UV/sun exposure - diffuse scaly erythematous macules with underlying dyspigmentation - Recommend daily broad spectrum sunscreen SPF 30+ to sun-exposed areas, reapply every 2 hours as needed.  - Staying in the shade or wearing long sleeves, sun glasses (UVA+UVB protection) and wide brim hats (4-inch brim around the entire circumference of the hat) are also recommended for sun protection.  - Call for new or changing lesions.  Skin cancer screening performed today.  Return in about 1 year (around 06/24/2022) for tbse. IRuthell Rummage, CMA, am acting as scribe  for Sarina Ser, MD. Documentation: I have reviewed the above documentation for accuracy and completeness, and I agree with the above.  Sarina Ser, MD

## 2021-06-24 NOTE — Patient Instructions (Addendum)
Biopsy Wound Care Instructions  Leave the original bandage on for 24 hours if possible.  If the bandage becomes soaked or soiled before that time, it is OK to remove it and examine the wound.  A small amount of post-operative bleeding is normal.  If excessive bleeding occurs, remove the bandage, place gauze over the site and apply continuous pressure (no peeking) over the area for 30 minutes. If this does not work, please call our clinic as soon as possible or page your doctor if it is after hours.   Once a day, cleanse the wound with soap and water. It is fine to shower. If a thick crust develops you may use a Q-tip dipped into dilute hydrogen peroxide (mix 1:1 with water) to dissolve it.  Hydrogen peroxide can slow the healing process, so use it only as needed.    After washing, apply petroleum jelly (Vaseline) or an antibiotic ointment if your doctor prescribed one for you, followed by a bandage.    For best healing, the wound should be covered with a layer of ointment at all times. If you are not able to keep the area covered with a bandage to hold the ointment in place, this may mean re-applying the ointment several times a day.  Continue this wound care until the wound has healed and is no longer open.   Itching and mild discomfort is normal during the healing process. However, if you develop pain or severe itching, please call our office.   If you have any discomfort, you can take Tylenol (acetaminophen) or ibuprofen as directed on the bottle. (Please do not take these if you have an allergy to them or cannot take them for another reason).  Some redness, tenderness and white or yellow material in the wound is normal healing.  If the area becomes very sore and red, or develops a thick yellow-green material (pus), it may be infected; please notify us.    If you have stitches, return to clinic as directed to have the stitches removed. You will continue wound care for 2-3 days after the stitches  are removed.   Wound healing continues for up to one year following surgery. It is not unusual to experience pain in the scar from time to time during the interval.  If the pain becomes severe or the scar thickens, you should notify the office.    A slight amount of redness in a scar is expected for the first six months.  After six months, the redness will fade and the scar will soften and fade.  The color difference becomes less noticeable with time.  If there are any problems, return for a post-op surgery check at your earliest convenience.  To improve the appearance of the scar, you can use silicone scar gel, cream, or sheets (such as Mederma or Serica) every night for up to one year. These are available over the counter (without a prescription).  Please call our office at [phone] for any questions or concerns.  Actinic keratoses are precancerous spots that appear secondary to cumulative UV radiation exposure/sun exposure over time. They are chronic with expected duration over 1 year. A portion of actinic keratoses will progress to squamous cell carcinoma of the skin. It is not possible to reliably predict which spots will progress to skin cancer and so treatment is recommended to prevent development of skin cancer.  Recommend daily broad spectrum sunscreen SPF 30+ to sun-exposed areas, reapply every 2 hours as needed.  Recommend staying  in the shade or wearing long sleeves, sun glasses (UVA+UVB protection) and wide brim hats (4-inch brim around the entire circumference of the hat). Call for new or changing lesions.   Cryotherapy Aftercare  Wash gently with soap and water everyday.   Apply Vaseline and Band-Aid daily until healed.         Seborrheic Keratosis  What causes seborrheic keratoses? Seborrheic keratoses are harmless, common skin growths that first appear during adult life.  As time goes by, more growths appear.  Some people may develop a large number of them.   Seborrheic keratoses appear on both covered and uncovered body parts.  They are not caused by sunlight.  The tendency to develop seborrheic keratoses can be inherited.  They vary in color from skin-colored to gray, brown, or even black.  They can be either smooth or have a rough, warty surface.   Seborrheic keratoses are superficial and look as if they were stuck on the skin.  Under the microscope this type of keratosis looks like layers upon layers of skin.  That is why at times the top layer may seem to fall off, but the rest of the growth remains and re-grows.    Treatment Seborrheic keratoses do not need to be treated, but can easily be removed in the office.  Seborrheic keratoses often cause symptoms when they rub on clothing or jewelry.  Lesions can be in the way of shaving.  If they become inflamed, they can cause itching, soreness, or burning.  Removal of a seborrheic keratosis can be accomplished by freezing, burning, or surgery. If any spot bleeds, scabs, or grows rapidly, please return to have it checked, as these can be an indication of a skin cancer.  Melanoma ABCDEs  Melanoma is the most dangerous type of skin cancer, and is the leading cause of death from skin disease.  You are more likely to develop melanoma if you: Have light-colored skin, light-colored eyes, or red or blond hair Spend a lot of time in the sun Tan regularly, either outdoors or in a tanning bed Have had blistering sunburns, especially during childhood Have a close family member who has had a melanoma Have atypical moles or large birthmarks  Early detection of melanoma is key since treatment is typically straightforward and cure rates are extremely high if we catch it early.   The first sign of melanoma is often a change in a mole or a new dark spot.  The ABCDE system is a way of remembering the signs of melanoma.  A for asymmetry:  The two halves do not match. B for border:  The edges of the growth are  irregular. C for color:  A mixture of colors are present instead of an even brown color. D for diameter:  Melanomas are usually (but not always) greater than 33m - the size of a pencil eraser. E for evolution:  The spot keeps changing in size, shape, and color.  Please check your skin once per month between visits. You can use a small mirror in front and a large mirror behind you to keep an eye on the back side or your body.   If you see any new or changing lesions before your next follow-up, please call to schedule a visit.  Please continue daily skin protection including broad spectrum sunscreen SPF 30+ to sun-exposed areas, reapplying every 2 hours as needed when you're outdoors.   Staying in the shade or wearing long sleeves, sun glasses (UVA+UVB protection)  and wide brim hats (4-inch brim around the entire circumference of the hat) are also recommended for sun protection.    If you have any questions or concerns for your doctor, please call our main line at [phone] and press option 4 to reach your doctor's medical assistant. If no one answers, please leave a voicemail as directed and we will return your call as soon as possible. Messages left after 4 pm will be answered the following business day.   You may also send [place] a message via [place]. We typically respond to MyChart messages within 1-2 business days.  For prescription refills, please ask your pharmacy to contact our office. Our fax number is [phone].  If you have an urgent issue when the clinic is closed that cannot wait until the next business day, you can page your doctor at the number below.    Please note that while we do our best to be available for urgent issues outside of office hours, we are not available 24/7.   If you have an urgent issue and are unable to reach [place], you may choose to seek medical care at your doctor's office, retail clinic, urgent care center, or emergency room.  If you have a medical  emergency, please immediately call 911 or go to the emergency department.  Pager Numbers  - Dr. Nehemiah Massed: [phone]  - Dr. Laurence Ferrari: [phone]  - Dr. Nicole Kindred: [phone]  In the event of inclement weather, please call our main line at [phone] for an update on the status of any delays or closures.  Dermatology Medication Tips: Please keep the boxes that topical medications come in in order to help keep track of the instructions about where and how to use these. Pharmacies typically print the medication instructions only on the boxes and not directly on the medication tubes.   If your medication is too expensive, please contact our office at [phone] option 4 or send [place] a message through [place].   We are unable to tell what your co-pay for medications will be in advance as this is different depending on your insurance coverage. However, we may be able to find a substitute medication at lower cost or fill out paperwork to get insurance to cover a needed medication.   If a prior authorization is required to get your medication covered by your insurance company, please allow [place] 1-2 business days to complete this process.  Drug prices often vary depending on where the prescription is filled and some pharmacies may offer cheaper prices.  The website www.goodrx.com contains coupons for medications through different pharmacies. The prices here do not account for what the cost may be with help from insurance (it may be cheaper with your insurance), but the website can give you the price if you did not use any insurance.  - You can print the associated coupon and take it with your prescription to the pharmacy.  - You may also stop by our office during regular business hours and pick up a GoodRx coupon card.  - If you need your prescription sent electronically to a different pharmacy, notify our office through [facility] or by phone at [phone] option 4.

## 2021-06-27 ENCOUNTER — Encounter: Payer: Self-pay | Admitting: Podiatry

## 2021-06-27 ENCOUNTER — Encounter: Payer: Self-pay | Admitting: Dermatology

## 2021-06-27 ENCOUNTER — Ambulatory Visit (INDEPENDENT_AMBULATORY_CARE_PROVIDER_SITE_OTHER): Payer: Medicare Other | Admitting: Podiatry

## 2021-06-27 ENCOUNTER — Other Ambulatory Visit: Payer: Self-pay

## 2021-06-27 DIAGNOSIS — L600 Ingrowing nail: Secondary | ICD-10-CM | POA: Diagnosis not present

## 2021-06-27 DIAGNOSIS — B351 Tinea unguium: Secondary | ICD-10-CM

## 2021-06-27 DIAGNOSIS — D696 Thrombocytopenia, unspecified: Secondary | ICD-10-CM

## 2021-06-27 NOTE — Progress Notes (Signed)
This patient returns  to the office concerned with pain in his left big toe.  He says he has intermittant pain in both great toes with his left great toe greater than his right toe. He presents to the office for evaluation and treatment.  Vascular  Dorsalis pedis and posterior tibial pulses are palpable  B/L.  Capillary return  WNL.  Temperature gradient is  WNL.  Skin turgor  WNL  Sensorium  Senn Weinstein monofilament wire  WNL. Normal tactile sensation.  Nail Exam  Patient has normal nails with no evidence of bacterial .    Thick hallux nails with incurvation noted both borders both great toes.  Orthopedic  Exam  Muscle tone and muscle strength  WNL.  No limitations of motion feet  B/L.  No crepitus or joint effusion noted.  Foot type is unremarkable and digits show no abnormalities.  Bony prominences are unremarkable.  Skin  No open lesions.  Normal skin texture and turgor.  Ingrown nails Hallux  B/L.  Onychomycosis  Hallux  B/L.    Debrided hallux nails with nail nipper followed by dremel tool.  RTC 10 weeks   Gardiner Barefoot DPM

## 2021-06-28 ENCOUNTER — Telehealth: Payer: Self-pay

## 2021-06-28 NOTE — Telephone Encounter (Signed)
Advised pt of bx results/sh ?

## 2021-06-28 NOTE — Telephone Encounter (Signed)
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

## 2021-07-07 ENCOUNTER — Encounter: Payer: Self-pay | Admitting: Dermatology

## 2021-07-07 DIAGNOSIS — B353 Tinea pedis: Secondary | ICD-10-CM

## 2021-07-08 MED ORDER — KETOCONAZOLE 2 % EX CREA: TOPICAL_CREAM | CUTANEOUS | 6 refills | Status: DC

## 2021-07-09 MED ORDER — KETOCONAZOLE 2 % EX CREA
1.0000 | TOPICAL_CREAM | Freq: Every day | CUTANEOUS | 6 refills | Status: DC
Start: 2021-07-09 — End: 2021-08-06

## 2021-07-09 NOTE — Addendum Note (Signed)
Addended by: Rudell Cobb A on: 07/09/2021 10:34 AM   Modules accepted: Orders

## 2021-08-04 ENCOUNTER — Encounter: Payer: Self-pay | Admitting: Dermatology

## 2021-08-06 ENCOUNTER — Other Ambulatory Visit: Payer: Self-pay

## 2021-08-06 DIAGNOSIS — B353 Tinea pedis: Secondary | ICD-10-CM

## 2021-08-06 MED ORDER — KETOCONAZOLE 2 % EX CREA
1.0000 | TOPICAL_CREAM | Freq: Every day | CUTANEOUS | 11 refills | Status: AC
Start: 2021-08-06 — End: ?

## 2021-09-19 ENCOUNTER — Ambulatory Visit: Payer: Medicare Other | Admitting: Podiatry

## 2021-10-10 ENCOUNTER — Ambulatory Visit (INDEPENDENT_AMBULATORY_CARE_PROVIDER_SITE_OTHER): Payer: Medicare Other | Admitting: Podiatry

## 2021-10-10 ENCOUNTER — Encounter: Payer: Self-pay | Admitting: Podiatry

## 2021-10-10 ENCOUNTER — Other Ambulatory Visit: Payer: Self-pay

## 2021-10-10 DIAGNOSIS — B351 Tinea unguium: Secondary | ICD-10-CM

## 2021-10-10 DIAGNOSIS — D696 Thrombocytopenia, unspecified: Secondary | ICD-10-CM

## 2021-10-10 NOTE — Progress Notes (Signed)
This patient returns  to the office concerned with pain in his left big toe.  He says he has intermittant pain in both great toes with his left great toe greater than his right toe. He presents to the office for evaluation and treatment.  Vascular  Dorsalis pedis and posterior tibial pulses are palpable  B/L.  Capillary return  WNL.  Temperature gradient is  WNL.  Skin turgor  WNL  Sensorium  Senn Weinstein monofilament wire  WNL. Normal tactile sensation.  Nail Exam  Patient has normal nails with no evidence of bacterial .    Thick hallux nails with incurvation noted both borders both great toes.  Orthopedic  Exam  Muscle tone and muscle strength  WNL.  No limitations of motion feet  B/L.  No crepitus or joint effusion noted.  Foot type is unremarkable and digits show no abnormalities.  Bony prominences are unremarkable.  Skin  No open lesions.  Normal skin texture and turgor.  Ingrown nails Hallux  B/L.  Onychomycosis  Hallux  B/L.    Debrided hallux nails with nail nipper followed by dremel tool.  RTC 12 weeks   Gardiner Barefoot DPM

## 2021-10-31 ENCOUNTER — Other Ambulatory Visit: Payer: Self-pay | Admitting: Family Medicine

## 2021-10-31 DIAGNOSIS — N62 Hypertrophy of breast: Secondary | ICD-10-CM

## 2021-11-01 ENCOUNTER — Ambulatory Visit
Admission: RE | Admit: 2021-11-01 | Discharge: 2021-11-01 | Disposition: A | Discharge status: Home / Self Care | Payer: Medicare Other | Source: Ambulatory Visit | Attending: Family Medicine | Admitting: Family Medicine

## 2021-11-01 ENCOUNTER — Other Ambulatory Visit: Payer: Self-pay

## 2021-11-01 DIAGNOSIS — N62 Hypertrophy of breast: Secondary | ICD-10-CM

## 2021-11-04 DIAGNOSIS — N62 Hypertrophy of breast: Secondary | ICD-10-CM | POA: Insufficient documentation

## 2021-12-25 ENCOUNTER — Ambulatory Visit (INDEPENDENT_AMBULATORY_CARE_PROVIDER_SITE_OTHER): Payer: Medicare Other | Admitting: Dermatology

## 2021-12-25 ENCOUNTER — Other Ambulatory Visit: Payer: Self-pay

## 2021-12-25 DIAGNOSIS — B353 Tinea pedis: Secondary | ICD-10-CM | POA: Diagnosis not present

## 2021-12-25 DIAGNOSIS — L2081 Atopic neurodermatitis: Secondary | ICD-10-CM | POA: Diagnosis not present

## 2021-12-25 MED ORDER — MOMETASONE FUROATE 0.1 % EX CREA: 1.0000 "application " | TOPICAL_CREAM | CUTANEOUS | 1 refills | Status: DC

## 2021-12-25 MED ORDER — FLUCONAZOLE 200 MG PO TABS: 200.0000 mg | ORAL_TABLET | ORAL | 0 refills | Status: AC

## 2021-12-25 NOTE — Patient Instructions (Addendum)
Start Mometasone cream daily 5 days a week to lower legs, ankles, tops of feet ?Stop Ketoconazole 2%  cream for now ?Start Diflucan '200mg'$  1 pill 3 days a week for 1 month ?Plan True Patch Test 36 on f/u ?Continue hypoallergenic socks ? ? ? ? ?If You Need Anything After Your Visit ? ?If you have any questions or concerns for your doctor, please call our main line at [phone] and press option 4 to reach your doctor's medical assistant. If no one answers, please leave a voicemail as directed and we will return your call as soon as possible. Messages left after 4 pm will be answered the following business day.  ? ?You may also send [place] a message via MyChart. We typically respond to MyChart messages within 1-2 business days. ? ?For prescription refills, please ask your pharmacy to contact our office. Our fax number is [phone]. ? ?If you have an urgent issue when the clinic is closed that cannot wait until the next business day, you can page your doctor at the number below.   ? ?Please note that while we do our best to be available for urgent issues outside of office hours, we are not available 24/7.  ? ?If you have an urgent issue and are unable to reach [place], you may choose to seek medical care at your doctor's office, retail clinic, urgent care center, or emergency room. ? ?If you have a medical emergency, please immediately call 911 or go to the emergency department. ? ?Pager Numbers ? ?- Dr. Nehemiah Massed: [phone] ? ?- Dr. Laurence Ferrari: [phone] ? ?- Dr. Nicole Kindred: [phone] ? ?In the event of inclement weather, please call our main line at [phone] for an update on the status of any delays or closures. ? ?Dermatology Medication Tips: ?Please keep the boxes that topical medications come in in order to help keep track of the instructions about where and how to use these. Pharmacies typically print the medication instructions only on the boxes and not directly on the medication tubes.  ? ?If your medication is  too expensive, please contact our office at [phone] option 4 or send [place] a message through [place].  ? ?We are unable to tell what your co-pay for medications will be in advance as this is different depending on your insurance coverage. However, we may be able to find a substitute medication at lower cost or fill out paperwork to get insurance to cover a needed medication.  ? ?If a prior authorization is required to get your medication covered by your insurance company, please allow [place] 1-2 business days to complete this process. ? ?Drug prices often vary depending on where the prescription is filled and some pharmacies may offer cheaper prices. ? ?The website www.goodrx.com contains coupons for medications through different pharmacies. The prices here do not account for what the cost may be with help from insurance (it may be cheaper with your insurance), but the website can give you the price if you did not use any insurance.  ?- You can print the associated coupon and take it with your prescription to the pharmacy.  ?- You may also stop by our office during regular business hours and pick up a GoodRx coupon card.  ?- If you need your prescription sent electronically to a different pharmacy, notify our office through [facility] or by phone at [phone] option 4. ? ? ? ? ?Si Usted Necesita Algo Despu?s de Su Visita ? ?Tambi?n puede enviarnos un mensaje a trav?s  de MyChart. Por lo general respondemos a los mensajes de MyChart en el transcurso de 1 a 2 d?as h?biles. ? ?Para renovar recetas, por favor pida a su farmacia que se ponga en contacto con nuestra oficina. Nuestro n?mero de fax es el [phone]. ? ?Si tiene un asunto urgente cuando la cl?nica est? cerrada y que no puede esperar hasta el siguiente d?a h?bil, puede llamar/localizar a su doctor(a) al n?mero que aparece a continuaci?n.  ? ?Por favor, tenga en cuenta que aunque hacemos todo lo posible para estar disponibles para asuntos urgentes  fuera del horario de oficina, no estamos disponibles las 24 horas del d?a, los 7 d?as de la semana.  ? ?Si tiene un problema urgente y no puede comunicarse con nosotros, puede optar por buscar atenci?n m?dica  en el consultorio de su doctor(a), en una cl?nica privada, en un centro de atenci?n urgente o en una sala de emergencias. ? ?Si tiene Engineer, maintenance (IT) m?dica, por favor llame inmediatamente al 911 o vaya a la sala de emergencias. ? ?N?meros de b?per ? ?- Dr. Nehemiah Massed: [phone] ? ?- Dra. Moye: [phone] ? ?- Dra. Nicole Kindred: [phone] ? ?En caso de inclemencias del tiempo, por favor llame a nuestra l?nea principal al [phone] para una actualizaci?n sobre el estado de cualquier retraso o cierre. ? ?Consejos para la medicaci?n en dermatolog?a: ?Por favor, guarde las cajas en las que vienen los medicamentos de uso t?pico para ayudarle a seguir las instrucciones sobre d?nde y c?mo usarlos. Las farmacias generalmente imprimen las instrucciones del medicamento s?lo en las cajas y no directamente en los tubos del [place].  ? ?Si su medicamento es muy caro, por favor, p?ngase en contacto con Zigmund Daniel llamando al [phone] y presione la opci?n 4 o env?enos un mensaje a trav?s de MyChart.  ? ?No podemos decirle cu?l ser? su copago por los medicamentos por adelantado ya que esto es diferente dependiendo de la cobertura de su seguro. Sin embargo, es posible que podamos encontrar un medicamento sustituto a Electrical engineer un formulario para que el seguro cubra el medicamento que se considera necesario.  ? ?Si se requiere Ardelia Mems autorizaci?n previa para que su compa??a de seguros [place] su medicamento, por favor perm?tanos de 1 a 2 d?as h?biles para completar este proceso. ? ?Los precios de los medicamentos var?an con frecuencia dependiendo del Environmental consultant de d?nde se surte la receta y alguna farmacias pueden ofrecer precios m?s baratos. ? ?El sitio web www.goodrx.com tiene cupones para medicamentos de  Airline pilot. Los precios aqu? no tienen en cuenta lo que podr?a costar con la ayuda del seguro (puede ser m?s barato con su seguro), pero el sitio web puede darle el precio si no utiliz? ning?n seguro.  ?- Puede imprimir el cup?n correspondiente y llevarlo con su receta a la farmacia.  ?- Tambi?n puede pasar por nuestra oficina durante el horario de atenci?n regular y recoger una tarjeta de cupones de GoodRx.  ?- Si necesita que su receta se env?e electr?nicamente a Chiropodist, informe a nuestra oficina a trav?s de MyChart de [place] o por tel?fono llamando al [phone] y presione la opci?n 4.  ?

## 2021-12-25 NOTE — Progress Notes (Signed)
? ?  Follow-Up Visit ?  ?Subjective  ?Benjamin Deleon is a 81 y.o. male who presents for the following: Rash (Feet, pt thinks an allergic reaction, 27m changed to hypoallergenic soaks and starting gold bond to feet, ~2wks, itchy improved since changing socks and using gold bond, hx of tinea pedis and using Ketoconazole 2% cream qam). ? ?The following portions of the chart were reviewed this encounter and updated as appropriate:  ? Tobacco  Allergies  Meds  Problems  Med Hx  Surg Hx  Fam Hx   ?  ?Review of Systems:  No other skin or systemic complaints except as noted in HPI or Assessment and Plan. ? ?Objective  ?Well appearing patient in no apparent distress; mood and affect are within normal limits. ? ?A focused examination was performed including feet. Relevant physical exam findings are noted in the Assessment and Plan. ? ?lower legs, ankles, tops of feet ?Fine moccasin type peeling on feet, heels ? ? 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 Condition is symptomatic / bothersome to patient. Not to goal. ? ?Start Mometasone cream qd 5d/wk to lower legs, ankles, tops of feet ? ?Plan True Patch Test 36 on f/u ?Cont hypoallergenic socks ? ?mometasone (ELOCON) 0.1 % cream - lower legs, ankles, tops of feet ?Apply 1 application. topically as directed. Apply to lower legs, ankles and tops of feet qhs 5 days a week until clear, then prn flares ? ?Tinea pedis ?Feet ?Chronic and persistent condition with duration or expected duration over one year. Condition is symptomatic / bothersome to patient. Not to goal. ?D/C Ketoconazole 2%  cr for now ?Start Diflucan '200mg'$  1 po 3d/wk for 1 month ?fluconazole (DIFLUCAN) 200 MG tablet - lower legs, ankles, tops of feet ?Take 1 tablet (200 mg total) by mouth 3 (three) times a week. ? ?Return in about 3 months (around 03/27/2022)  for f/u Atopic derm vs ACD, patch testing on f/u. ? ?I, SOthelia Pulling RMA, am acting as scribe for DSarina Ser MD . ?Documentation: I have reviewed the above documentation for accuracy and completeness, and I agree with the above. ? ?DSarina Ser MD ? ?

## 2021-12-29 ENCOUNTER — Encounter: Payer: Self-pay | Admitting: Dermatology

## 2022-01-16 ENCOUNTER — Ambulatory Visit (INDEPENDENT_AMBULATORY_CARE_PROVIDER_SITE_OTHER): Payer: Medicare Other | Admitting: Podiatry

## 2022-01-16 ENCOUNTER — Encounter: Payer: Self-pay | Admitting: Podiatry

## 2022-01-16 DIAGNOSIS — D696 Thrombocytopenia, unspecified: Secondary | ICD-10-CM | POA: Diagnosis not present

## 2022-01-16 DIAGNOSIS — B351 Tinea unguium: Secondary | ICD-10-CM | POA: Diagnosis not present

## 2022-01-16 NOTE — Progress Notes (Signed)
This patient returns  to the office concerned with pain in his left big toe.  He says he has intermittant pain in both great toes with his left great toe greater than his right toe. He presents to the office for evaluation and treatment. ? ?Vascular  Dorsalis pedis and posterior tibial pulses are palpable  B/L.  Capillary return  WNL.  Temperature gradient is  WNL.  Skin turgor  WNL ? ?Sensorium  Senn Weinstein monofilament wire  WNL. Normal tactile sensation. ? ?Nail Exam  Patient has normal nails with no evidence of bacterial .    Thick hallux nails with incurvation noted both borders both great toes. ? ?Orthopedic  Exam  Muscle tone and muscle strength  WNL.  No limitations of motion feet  B/L.  No crepitus or joint effusion noted.  Foot type is unremarkable and digits show no abnormalities.  Bony prominences are unremarkable. ? ?Skin  No open lesions.  Normal skin texture and turgor. ? ?Ingrown nails Hallux  B/L.  Onychomycosis  Hallux  B/L. ? ?  Debrided hallux nails with nail nipper followed by dremel tool.  RTC 12 weeks  ? ?Gardiner Barefoot DPM ?

## 2022-02-14 ENCOUNTER — Ambulatory Visit (INDEPENDENT_AMBULATORY_CARE_PROVIDER_SITE_OTHER): Payer: Medicare Other | Admitting: Urology

## 2022-02-14 ENCOUNTER — Encounter: Payer: Self-pay | Admitting: Urology

## 2022-02-14 VITALS — BP 163/83 | HR 65 | Ht 72.0 in | Wt 180.0 lb

## 2022-02-14 DIAGNOSIS — R339 Retention of urine, unspecified: Secondary | ICD-10-CM | POA: Diagnosis not present

## 2022-02-14 DIAGNOSIS — R351 Nocturia: Secondary | ICD-10-CM | POA: Diagnosis not present

## 2022-02-14 DIAGNOSIS — R3589 Other polyuria: Secondary | ICD-10-CM | POA: Diagnosis not present

## 2022-02-14 DIAGNOSIS — R350 Frequency of micturition: Secondary | ICD-10-CM

## 2022-02-14 LAB — URINALYSIS, COMPLETE
Bilirubin, UA: NEGATIVE
Glucose, UA: NEGATIVE
Ketones, UA: NEGATIVE
Leukocytes,UA: NEGATIVE
Nitrite, UA: NEGATIVE
Protein,UA: NEGATIVE
Specific Gravity, UA: 1.01 (ref 1.005–1.030)
Urobilinogen, Ur: 0.2 mg/dL (ref 0.2–1.0)
pH, UA: 6 (ref 5.0–7.5)

## 2022-02-14 LAB — MICROSCOPIC EXAMINATION: Bacteria, UA: NONE SEEN

## 2022-02-14 LAB — BLADDER SCAN AMB NON-IMAGING: Scan Result: 0

## 2022-02-14 NOTE — Progress Notes (Signed)
? ?02/14/2022 ?12:43 PM  ? ?Boris Sharper ?Dec 12, 1940 ?[id number] ? ?Referring provider: Derinda Late, MD ?[address] ?[facility] ?[place],  [place] [address] ? ?Chief Complaint  ?Patient presents with  ? Other  ? ? ?HPI: ?81 y.o. male presents for annual follow-up. ? 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 ?PMH: ?Past Medical History:  ?Diagnosis Date  ? Arthritis   ? 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 Left proximal tricep. Mild atypia, limited margins free.  ? 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 Right distal lat. deltoid. Mild atypia, limited margins free.  ? Dysplastic nevus 06/24/2021  ? right mid back 3 cm lateral to spine, mod atypia  ? GERD (gastroesophageal reflux disease)   ? Polymyalgia rheumatica ([place])   ? Sleep apnea   ? uses dental appliance  ? Wears hearing aid in both ears   ? ? ?Surgical History: ?Past Surgical History:  ?Procedure Laterality Date  ? CATARACT EXTRACTION W/PHACO Right 02/22/2020  ? Procedure: CATARACT EXTRACTION PHACO AND INTRAOCULAR LENS PLACEMENT (IOC) RIGHT 14.63 01:24.4 17.3%;  Surgeon: Leandrew Koyanagi, MD;  Location: [facility];  Service: Ophthalmology;  Laterality: Right;  sleep apnea  ? CATARACT EXTRACTION W/PHACO Left 03/21/2020  ? Procedure: CATARACT EXTRACTION PHACO AND INTRAOCULAR LENS PLACEMENT (IOC) LEFT 8.85  01:05.1  13.6%;  Surgeon:  Leandrew Koyanagi, MD;  Location: [facility];  Service: Ophthalmology;  Laterality: Left;  ? COLONOSCOPY    ? ? ?Home Medications:  ?Allergies as of 02/14/2022   ? ?   Reactions  ? Lactose Other (See Comments)  ? Gas, bloating  ? Lactose Intolerance (gi) Diarrhea  ? Bloating   ? Wound Dressing Adhesive Itching  ? Molnupiravir Rash  ? ?  ? ?  ?Medication List  ?  ? ?  ? Accurate as of Feb 14, 2022 12:43 PM. If you have any questions, ask your nurse or doctor.  ?  ?  ? ?  ? ?amitriptyline 10 MG tablet ?Commonly known as: ELAVIL ?Take by mouth. ?  ?diclofenac Sodium 1 % Gel ?Commonly known as: VOLTAREN ?APPLY TOPICALLY 3 TIMES A  DAY AS NEEDED ?  ?famotidine 10 MG tablet ?Commonly known as: PEPCID ?Take 5 mg by mouth 2 (two) times daily. ?  ?finasteride 5 MG tablet ?Commonly known as: PROSCAR ?Take 1 tablet by mouth daily. ?  ?ketoconazole 2 % cream ?Commonly known as: NIZORAL ?Apply 1 application topically at bedtime. Apply to entire foot, in between toes, side of foot (apply to both feet) QHS. ?  ?Lagevrio 200 MG Caps capsule ?Generic drug: molnupiravir EUA ?Take 4 capsules by mouth 2 (two) times daily. ?  ?methotrexate 2.5 MG tablet ?Commonly known as: RHEUMATREX ?Take by mouth. ?  ?mometasone 0.1 % cream ?Commonly known as: ELOCON ?Apply 1 application. topically as directed. Apply to lower legs, ankles and tops of feet qhs 5 days a week until clear, then  prn flares ?  ?simvastatin 5 MG tablet ?Commonly known as: ZOCOR ?Take 5 mg by mouth daily. ?  ?Vitamin D3 50 MCG (2000 UT) Tabs ?Take by mouth daily. ?  ? ?  ? ? ?Allergies:  ?Allergies  ?Allergen Reactions  ? Lactose Other (See Comments)  ?  Gas, bloating  ? Lactose Intolerance (Gi) Diarrhea  ?  Bloating   ? Wound Dressing Adhesive Itching  ? Molnupiravir Rash  ? ? ?Family History: ?Family History  ?Problem Relation Age of Onset  ? Bladder Cancer Father   ? ? ?Social History:  reports that he has never smoked. He has never used smokeless tobacco. He  reports current alcohol use. He reports that he does not use drugs. ? ? ?Physical Exam: ?BP (!) 163/83   Pulse 65   Ht 6' (1.829 m)   Wt 180 lb (81.6 kg)   BMI 24.41 kg/m?   ?Constitutional:  Alert and oriented, No acute distress. ?HEENT: [place] AT, moist mucus membranes.  Trachea midline, no masses. ?Respiratory: Normal respiratory effort, no increased work of breathing. ?Psychiatric: Normal mood and affect. ? ?Laboratory Data: ? ?Urinalysis ?Dipstick/microscopy negative ? ? ?Assessment & Plan:   ? ?1.  Urinary retention ?Secondary to hypotonic bladder ?Urine output ~ 3 L/day with fluid intake 1.5 L ?Will refer to ENT for sleep study ?Continue CIC ?Follow-up 6 months ? ? ?Abbie Sons, MD ? ?[facility] ?[address] ?[place], [place] [address] ?[phone] ? ?

## 2022-02-16 ENCOUNTER — Encounter: Payer: Self-pay | Admitting: Urology

## 2022-02-20 ENCOUNTER — Ambulatory Visit: Payer: Medicare Other | Admitting: Urology

## 2022-04-10 ENCOUNTER — Encounter: Payer: Self-pay | Admitting: Podiatry

## 2022-04-10 ENCOUNTER — Ambulatory Visit (INDEPENDENT_AMBULATORY_CARE_PROVIDER_SITE_OTHER): Payer: Medicare Other | Admitting: Podiatry

## 2022-04-10 DIAGNOSIS — B351 Tinea unguium: Secondary | ICD-10-CM

## 2022-04-10 DIAGNOSIS — Z862 Personal history of diseases of the blood and blood-forming organs and certain disorders involving the immune mechanism: Secondary | ICD-10-CM

## 2022-04-10 DIAGNOSIS — D696 Thrombocytopenia, unspecified: Secondary | ICD-10-CM | POA: Diagnosis not present

## 2022-04-10 NOTE — Progress Notes (Signed)
This patient returns to my office for at risk foot care.  This patient requires this care by a professional since this patient will be at risk due to having thrombocytopenia.  This patient is unable to cut nails himself since the patient cannot reach his nails.These nails are painful walking and wearing shoes.  This patient presents for at risk foot care today.  General Appearance  Alert, conversant and in no acute stress.  Vascular  Dorsalis pedis and posterior tibial  pulses are palpable  bilaterally.  Capillary return is within normal limits  bilaterally. Temperature is within normal limits  bilaterally.  Neurologic  Senn-Weinstein monofilament wire test within normal limits  bilaterally. Muscle power within normal limits bilaterally.  Nails Thick disfigured discolored nails with subungual debris  from hallux to fifth toes bilaterally. No evidence of bacterial infection or drainage bilaterally.  Orthopedic  No limitations of motion  feet .  No crepitus or effusions noted.  No bony pathology or digital deformities noted.  Skin  normotropic skin with no porokeratosis noted bilaterally.  No signs of infections or ulcers noted.     Onychomycosis  Pain in right toes  Pain in left toes  Consent was obtained for treatment procedures.   Mechanical debridement of nails 1-5  bilaterally performed with a nail nipper.  Filed with dremel without incident.    Return office visit    10 weeks.                 Told patient to return for periodic foot care and evaluation due to potential at risk complications.   Gardiner Barefoot DPM

## 2022-04-14 ENCOUNTER — Ambulatory Visit: Payer: Medicare Other

## 2022-04-16 ENCOUNTER — Ambulatory Visit: Payer: Medicare Other

## 2022-04-21 ENCOUNTER — Ambulatory Visit: Payer: Medicare Other | Admitting: Dermatology

## 2022-04-28 ENCOUNTER — Ambulatory Visit (INDEPENDENT_AMBULATORY_CARE_PROVIDER_SITE_OTHER): Payer: Medicare Other | Admitting: Dermatology

## 2022-04-28 DIAGNOSIS — R21 Rash and other nonspecific skin eruption: Secondary | ICD-10-CM

## 2022-04-28 DIAGNOSIS — B353 Tinea pedis: Secondary | ICD-10-CM

## 2022-04-28 NOTE — Progress Notes (Signed)
   Follow-Up Visit   Subjective  Benjamin Deleon is a 81 y.o. male who presents for the following: Rash (On the lower legs and feet - patient has had three flares since the last visit for which he used the Mometasone 0.1% cream and it improved).  The following portions of the chart were reviewed this encounter and updated as appropriate:   Tobacco  Allergies  Meds  Problems  Med Hx  Surg Hx  Fam Hx     Review of Systems:  No other skin or systemic complaints except as noted in HPI or Assessment and Plan.  Objective  Well appearing patient in no apparent distress; mood and affect are within normal limits.  A focused examination was performed including the lower legs and feet. Relevant physical exam findings are noted in the Assessment and Plan.  Lower legs, ankles, top of feet Minimal patch of PIPA on the L foot. Elbows and knees clear.  B/L foot Scaling and maceration web spaces and over distal and lateral soles. No evidence of tinea on in between the toes or on the nails.   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 Condition is symptomatic / bothersome to patient. Not to goal.  Continue Mometasone 0.1% cream to aa's QD-BID PRN up to 5d/wk. Continue hypoallergenic socks. Topical steroids (such as triamcinolone, fluocinolone, fluocinonide, mometasone, clobetasol, halobetasol, betamethasone, hydrocortisone) can cause thinning and lightening of the skin if they are used for too long in the same area. Your physician has selected the right strength medicine for your problem and area affected on the body. Please use your medication only as directed by your physician to prevent side effects.   Consider patch testing in the future if condition becomes more significant and bothersome.    Tinea pedis of both feet B/L foot S/P Diflucan - Chronic and persistent condition with duration or expected duration over one year. Condition is symptomatic / bothersome to patient. Not to goal.  Restart Ketoconazole 2% cream QHS.   Related Medications ketoconazole (NIZORAL) 2 % cream Apply 1 application topically at bedtime. Apply to entire foot, in between toes, side of foot (apply to both feet) QHS.  Return for appointment as scheduled for [facility].  [name], CMA, am acting as scribe for Sarina Ser, MD . Documentation: I have reviewed the above documentation for accuracy and completeness, and I agree with the above.  Sarina Ser, MD

## 2022-04-28 NOTE — Patient Instructions (Signed)
Due to recent changes in healthcare laws, you may see results of your pathology and/or laboratory studies on MyChart before the doctors have had a chance to review them. We understand that in some cases there may be results that are confusing or concerning to you. Please understand that not all results are received at the same time and often the doctors may need to interpret multiple results in order to provide you with the best plan of care or course of treatment. Therefore, we ask that you please give us 2 business days to thoroughly review all your results before contacting the office for clarification. Should we see a critical lab result, you will be contacted sooner.   If You Need Anything After Your Visit  If you have any questions or concerns for your doctor, please call our main line at [phone] and press option 4 to reach your doctor's medical assistant. If no one answers, please leave a voicemail as directed and we will return your call as soon as possible. Messages left after 4 pm will be answered the following business day.   You may also send us a message via MyChart. We typically respond to MyChart messages within 1-2 business days.  For prescription refills, please ask your pharmacy to contact our office. Our fax number is [phone].  If you have an urgent issue when the clinic is closed that cannot wait until the next business day, you can page your doctor at the number below.    Please note that while we do our best to be available for urgent issues outside of office hours, we are not available 24/7.   If you have an urgent issue and are unable to reach us, you may choose to seek medical care at your doctor's office, retail clinic, urgent care center, or emergency room.  If you have a medical emergency, please immediately call 911 or go to the emergency department.  Pager Numbers  - Dr. Kowalski: [phone]  - Dr. Moye: [phone]  - Dr. Stewart:  [phone]  In the event of inclement weather, please call our main line at [phone] for an update on the status of any delays or closures.  Dermatology Medication Tips: Please keep the boxes that topical medications come in in order to help keep track of the instructions about where and how to use these. Pharmacies typically print the medication instructions only on the boxes and not directly on the medication tubes.   If your medication is too expensive, please contact our office at [phone] option 4 or send us a message through MyChart.   We are unable to tell what your co-pay for medications will be in advance as this is different depending on your insurance coverage. However, we may be able to find a substitute medication at lower cost or fill out paperwork to get insurance to cover a needed medication.   If a prior authorization is required to get your medication covered by your insurance company, please allow us 1-2 business days to complete this process.  Drug prices often vary depending on where the prescription is filled and some pharmacies may offer cheaper prices.  The website www.goodrx.com contains coupons for medications through different pharmacies. The prices here do not account for what the cost may be with help from insurance (it may be cheaper with your insurance), but the website can give you the price if you did not use any insurance.  - You can print the associated coupon and take it with   your prescription to the pharmacy.  - You may also stop by our office during regular business hours and pick up a GoodRx coupon card.  - If you need your prescription sent electronically to a different pharmacy, notify our office through [facility] MyChart or by phone at [phone] option 4.     Si Usted Necesita Algo Despus de Su Visita  Tambin puede enviarnos un mensaje a travs de MyChart. Por lo general respondemos a los mensajes de MyChart en el transcurso de 1 a 2  das hbiles.  Para renovar recetas, por favor pida a su farmacia que se ponga en contacto con nuestra oficina. Nuestro nmero de fax es el [phone].  Si tiene un asunto urgente cuando la clnica est cerrada y que no puede esperar hasta el siguiente da hbil, puede llamar/localizar a su doctor(a) al nmero que aparece a continuacin.   Por favor, tenga en cuenta que aunque hacemos todo lo posible para estar disponibles para asuntos urgentes fuera del horario de oficina, no estamos disponibles las 24 horas del da, los 7 das de la semana.   Si tiene un problema urgente y no puede comunicarse con nosotros, puede optar por buscar atencin mdica  en el consultorio de su doctor(a), en una clnica privada, en un centro de atencin urgente o en una sala de emergencias.  Si tiene una emergencia mdica, por favor llame inmediatamente al 911 o vaya a la sala de emergencias.  Nmeros de bper  - Dr. Kowalski: [phone]  - Dra. Moye: [phone]  - Dra. Stewart: [phone]  En caso de inclemencias del tiempo, por favor llame a nuestra lnea principal al [phone] para una actualizacin sobre el estado de cualquier retraso o cierre.  Consejos para la medicacin en dermatologa: Por favor, guarde las cajas en las que vienen los medicamentos de uso tpico para ayudarle a seguir las instrucciones sobre dnde y cmo usarlos. Las farmacias generalmente imprimen las instrucciones del medicamento slo en las cajas y no directamente en los tubos del medicamento.   Si su medicamento es muy caro, por favor, pngase en contacto con nuestra oficina llamando al [phone] y presione la opcin 4 o envenos un mensaje a travs de MyChart.   No podemos decirle cul ser su copago por los medicamentos por adelantado ya que esto es diferente dependiendo de la cobertura de su seguro. Sin embargo, es posible que podamos encontrar un medicamento sustituto a menor costo o llenar un formulario para que el  seguro cubra el medicamento que se considera necesario.   Si se requiere una autorizacin previa para que su compaa de seguros cubra su medicamento, por favor permtanos de 1 a 2 das hbiles para completar este proceso.  Los precios de los medicamentos varan con frecuencia dependiendo del lugar de dnde se surte la receta y alguna farmacias pueden ofrecer precios ms baratos.  El sitio web www.goodrx.com tiene cupones para medicamentos de diferentes farmacias. Los precios aqu no tienen en cuenta lo que podra costar con la ayuda del seguro (puede ser ms barato con su seguro), pero el sitio web puede darle el precio si no utiliz ningn seguro.  - Puede imprimir el cupn correspondiente y llevarlo con su receta a la farmacia.  - Tambin puede pasar por nuestra oficina durante el horario de atencin regular y recoger una tarjeta de cupones de GoodRx.  - Si necesita que su receta se enve electrnicamente a una farmacia diferente, informe a nuestra oficina a travs de MyChart de [facility]   o por telfono llamando al [phone] y presione la opcin 4.  

## 2022-05-03 ENCOUNTER — Encounter: Payer: Self-pay | Admitting: Dermatology

## 2022-05-06 ENCOUNTER — Encounter: Payer: Self-pay | Admitting: Urology

## 2022-05-07 ENCOUNTER — Telehealth: Payer: Self-pay | Admitting: *Deleted

## 2022-05-07 ENCOUNTER — Other Ambulatory Visit: Payer: Self-pay | Admitting: Urology

## 2022-05-07 DIAGNOSIS — R3589 Other polyuria: Secondary | ICD-10-CM

## 2022-05-07 NOTE — Telephone Encounter (Signed)
Patient states he would like a referral to nephrologist . Patient states yall talked about at his appt. He did get his sleep study done. Which he dont have sleep Apnea

## 2022-05-07 NOTE — Telephone Encounter (Signed)
Referral was placed 

## 2022-05-07 NOTE — Telephone Encounter (Signed)
Notified patient as instructed,.  

## 2022-05-14 ENCOUNTER — Ambulatory Visit (INDEPENDENT_AMBULATORY_CARE_PROVIDER_SITE_OTHER): Payer: Medicare Other | Admitting: Physician Assistant

## 2022-05-14 VITALS — BP 145/84 | HR 59 | Ht 72.0 in | Wt 175.0 lb

## 2022-05-14 DIAGNOSIS — R3589 Other polyuria: Secondary | ICD-10-CM

## 2022-05-14 DIAGNOSIS — R350 Frequency of micturition: Secondary | ICD-10-CM | POA: Diagnosis not present

## 2022-05-14 LAB — URINALYSIS, COMPLETE
Bilirubin, UA: NEGATIVE
Glucose, UA: NEGATIVE
Ketones, UA: NEGATIVE
Leukocytes,UA: NEGATIVE
Nitrite, UA: NEGATIVE
Protein,UA: NEGATIVE
Specific Gravity, UA: <1.005 — ABNORMAL LOW (ref 1.005–1.030)
Urobilinogen, Ur: 0.2 mg/dL (ref 0.2–1.0)
pH, UA: 5 (ref 5.0–7.5)

## 2022-05-14 LAB — MICROSCOPIC EXAMINATION: Bacteria, UA: NONE SEEN

## 2022-05-14 LAB — BLADDER SCAN AMB NON-IMAGING

## 2022-05-14 NOTE — Patient Instructions (Addendum)
Reduce your fluid intake to 2 liters daily. Try switching from Benefiber to Miralax to manage your constipation. You may adjust the Miralax dose as needed to produce your desired outcome. Call our office for catheter supply order changes as needed.

## 2022-05-14 NOTE — Progress Notes (Signed)
05/14/2022 10:12 AM   Boris Sharper 12-02-1940 [id number]  CC: Chief Complaint  Patient presents with   Urinary Frequency   HPI: Corwin Kuiken is a 81 y.o. male with PMH hypotonic bladder managed with CIC who presents today for evaluation of ongoing urinary frequency and polyuria.   Today he reports he continues to self cath 8 times daily.  He is not particularly bothered by his frequency of CIC, but he is concerned regarding his daily urinary output.  He measured his daily output with a urinal for about a week and averaged approximately 3 L of UOP daily.  He has also been measuring his fluid intake throughout the day and notes that he has been drinking approximately 3 L of fluids daily.  He states he drinks so much fluid because he has been advised by providers to stay well-hydrated.  He has chronic constipation which he manages with Benefiber supplements and he typically drinks 14 ounces of fluid when he takes his daily dose.  He is not yet at treatment goal for his constipation.  He also wears a dental appliance overnight and notes some dry mouth when this is in place, so he typically drinks water during nighttime awakenings as well.  He denies dry mouth during the daytime.  Notably, he completed a sleep study recently which was negative for OSA.  His PCP performed some blood work last week which is notable for normal serum sodium, 138, and normal creatinine, 1.1.  He is scheduled to see nephrology soon.  In-office UA today positive for trace intact blood; urine microscopy panel negative. PVR 61m.  Notably, urine is dilute with specific gravity less than 1.005.  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 Mild atypia, limited margins free.   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 deltoid. Mild atypia, limited margins free.   Dysplastic nevus 06/24/2021   right mid back 3 cm lateral to spine, mod atypia    GERD (gastroesophageal reflux disease)    Polymyalgia rheumatica ([facility])    Sleep apnea    uses dental appliance   Wears hearing aid in both ears     Surgical History: Past Surgical History:  Procedure Laterality Date   CATARACT EXTRACTION W/PHACO Right 02/22/2020   Procedure: CATARACT EXTRACTION PHACO AND INTRAOCULAR LENS PLACEMENT (IOC) RIGHT 14.63 01:24.4 17.3%;  Surgeon: BLeandrew Koyanagi MD;  [facility]  Service: Ophthalmology;  Laterality: Right;  sleep apnea   CATARACT EXTRACTION W/PHACO Left 03/21/2020   Procedure: CATARACT EXTRACTION PHACO AND INTRAOCULAR LENS PLACEMENT (IOC) LEFT 8.85  01:05.1  13.6%;  Surgeon: BLeandrew Koyanagi MD;  [facility]  Service: Ophthalmology;  Laterality: Left;   COLONOSCOPY      Home Medications:  Allergies as of 05/14/2022       Reactions   Lactose Other (See Comments)   Gas, bloating   Lactose Intolerance (gi) Diarrhea   Bloating    Wound Dressing Adhesive Itching   Molnupiravir Rash        Medication List        Accurate as of May 14, 2022 10:12 AM. If you have any questions, ask your nurse or doctor.          amitriptyline 10 MG tablet Commonly known as: ELAVIL Take by mouth.   diclofenac Sodium 1 % Gel Commonly known as: VOLTAREN APPLY TOPICALLY 3 TIMES A  DAY AS NEEDED  famotidine 10 MG tablet Commonly known as: PEPCID Take 5 mg by mouth 2 (two) times daily.   finasteride 5 MG tablet Commonly known as: PROSCAR Take 1 tablet by mouth daily.   ketoconazole 2 % cream Commonly known as: NIZORAL Apply 1 application topically at bedtime. Apply to entire foot, in between toes, side of foot (apply to both feet) QHS.   Lagevrio 200 MG Caps capsule Generic drug: molnupiravir EUA Take 4 capsules by mouth 2 (two) times daily.   methotrexate 2.5 MG tablet Commonly known as: RHEUMATREX Take by mouth.   mometasone 0.1 % cream Commonly known as: ELOCON Apply 1  application. topically as directed. Apply to lower legs, ankles and tops of feet qhs 5 days a week until clear, then prn flares   simvastatin 5 MG tablet Commonly known as: ZOCOR Take 5 mg by mouth daily.   Vitamin D3 50 MCG (2000 UT) Tabs Take by mouth daily.        Allergies:  Allergies  Allergen Reactions   Lactose Other (See Comments)    Gas, bloating   Lactose Intolerance (Gi) Diarrhea    Bloating    Wound Dressing Adhesive Itching   Molnupiravir Rash    Family History: Family History  Problem Relation Age of Onset   Bladder Cancer Father     Social History:   reports that he has never smoked. He has never used smokeless tobacco. He reports current alcohol use. He reports that he does not use drugs.  Physical Exam: BP (!) 145/84   Pulse (!) 59   Ht 6' (1.829 m)   Wt 175 lb (79.4 kg)   BMI 23.73 kg/m   Constitutional:  Alert and oriented, no acute distress, nontoxic appearing HEENT: [place], AT Cardiovascular: No clubbing, cyanosis, or edema Respiratory: Normal respiratory effort, no increased work of breathing Skin: No rashes, bruises or suspicious lesions Neurologic: Grossly intact, no focal deficits, moving all 4 extremities Psychiatric: Normal mood and affect  Laboratory Data: Results for orders placed or performed in visit on 05/14/22  Bladder Scan (Post Void Residual) in office  Result Value Ref Range   Scan Result 57m    Assessment & Plan:   1. Polyuria Normal recent serum electrolytes and renal function.  Fluid intake matches his daily urine output.  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  We discussed reducing his daily fluid intake to 2 L daily, which would still meet the general medical recommendation to drink approximately 64 ounces of fluid daily.  We also [facility], which may further reduce his fluid intake.  He expressed understanding.  Can  adjust catheter supply orders in the future as needed. - Urinalysis, Complete - Bladder Scan (Post Void Residual) in office  Return if symptoms worsen or fail to improve.  SDebroah Loop PA-C  [facility] [address] [place] [phone]

## 2022-05-23 ENCOUNTER — Other Ambulatory Visit: Payer: Self-pay | Admitting: Otolaryngology

## 2022-05-23 DIAGNOSIS — R001 Bradycardia, unspecified: Secondary | ICD-10-CM

## 2022-05-23 DIAGNOSIS — R42 Dizziness and giddiness: Secondary | ICD-10-CM

## 2022-06-10 ENCOUNTER — Other Ambulatory Visit: Payer: Medicare Other

## 2022-06-19 ENCOUNTER — Ambulatory Visit
Admission: RE | Admit: 2022-06-19 | Discharge: 2022-06-19 | Disposition: A | Discharge status: Home / Self Care | Payer: Medicare Other | Source: Ambulatory Visit | Attending: Otolaryngology | Admitting: Otolaryngology

## 2022-06-19 DIAGNOSIS — R001 Bradycardia, unspecified: Secondary | ICD-10-CM

## 2022-06-19 DIAGNOSIS — R42 Dizziness and giddiness: Secondary | ICD-10-CM

## 2022-06-25 ENCOUNTER — Ambulatory Visit (INDEPENDENT_AMBULATORY_CARE_PROVIDER_SITE_OTHER): Payer: Medicare Other | Admitting: Dermatology

## 2022-06-25 DIAGNOSIS — D239 Other benign neoplasm of skin, unspecified: Secondary | ICD-10-CM

## 2022-06-25 DIAGNOSIS — R21 Rash and other nonspecific skin eruption: Secondary | ICD-10-CM | POA: Diagnosis not present

## 2022-06-25 DIAGNOSIS — Z1283 Encounter for screening for malignant neoplasm of skin: Secondary | ICD-10-CM

## 2022-06-25 DIAGNOSIS — L820 Inflamed seborrheic keratosis: Secondary | ICD-10-CM | POA: Diagnosis not present

## 2022-06-25 DIAGNOSIS — L814 Other melanin hyperpigmentation: Secondary | ICD-10-CM | POA: Diagnosis not present

## 2022-06-25 DIAGNOSIS — L578 Other skin changes due to chronic exposure to nonionizing radiation: Secondary | ICD-10-CM

## 2022-06-25 DIAGNOSIS — D1801 Hemangioma of skin and subcutaneous tissue: Secondary | ICD-10-CM

## 2022-06-25 DIAGNOSIS — L821 Other seborrheic keratosis: Secondary | ICD-10-CM

## 2022-06-25 DIAGNOSIS — D2362 Other benign neoplasm of skin of left upper limb, including shoulder: Secondary | ICD-10-CM

## 2022-06-25 DIAGNOSIS — D229 Melanocytic nevi, unspecified: Secondary | ICD-10-CM

## 2022-06-25 NOTE — Patient Instructions (Signed)
Due to recent changes in healthcare laws, you may see results of your pathology and/or laboratory studies on MyChart before the doctors have had a chance to review them. We understand that in some cases there may be results that are confusing or concerning to you. Please understand that not all results are received at the same time and often the doctors may need to interpret multiple results in order to provide you with the best plan of care or course of treatment. Therefore, we ask that you please give us 2 business days to thoroughly review all your results before contacting the office for clarification. Should we see a critical lab result, you will be contacted sooner.   If You Need Anything After Your Visit  If you have any questions or concerns for your doctor, please call our main line at [phone] and press option 4 to reach your doctor's medical assistant. If no one answers, please leave a voicemail as directed and we will return your call as soon as possible. Messages left after 4 pm will be answered the following business day.   You may also send us a message via MyChart. We typically respond to MyChart messages within 1-2 business days.  For prescription refills, please ask your pharmacy to contact our office. Our fax number is [phone].  If you have an urgent issue when the clinic is closed that cannot wait until the next business day, you can page your doctor at the number below.    Please note that while we do our best to be available for urgent issues outside of office hours, we are not available 24/7.   If you have an urgent issue and are unable to reach us, you may choose to seek medical care at your doctor's office, retail clinic, urgent care center, or emergency room.  If you have a medical emergency, please immediately call 911 or go to the emergency department.  Pager Numbers  - Dr. Kowalski: [phone]  - Dr. Moye: [phone]  - Dr. Stewart:  [phone]  In the event of inclement weather, please call our main line at [phone] for an update on the status of any delays or closures.  Dermatology Medication Tips: Please keep the boxes that topical medications come in in order to help keep track of the instructions about where and how to use these. Pharmacies typically print the medication instructions only on the boxes and not directly on the medication tubes.   If your medication is too expensive, please contact our office at [phone] option 4 or send us a message through MyChart.   We are unable to tell what your co-pay for medications will be in advance as this is different depending on your insurance coverage. However, we may be able to find a substitute medication at lower cost or fill out paperwork to get insurance to cover a needed medication.   If a prior authorization is required to get your medication covered by your insurance company, please allow us 1-2 business days to complete this process.  Drug prices often vary depending on where the prescription is filled and some pharmacies may offer cheaper prices.  The website www.goodrx.com contains coupons for medications through different pharmacies. The prices here do not account for what the cost may be with help from insurance (it may be cheaper with your insurance), but the website can give you the price if you did not use any insurance.  - You can print the associated coupon and take it with   your prescription to the pharmacy.  - You may also stop by our office during regular business hours and pick up a GoodRx coupon card.  - If you need your prescription sent electronically to a different pharmacy, notify our office through [place] MyChart or by phone at [phone] option 4.     Si Usted Necesita Algo Despus de Su Visita  Tambin puede enviarnos un mensaje a travs de MyChart. Por lo general respondemos a los mensajes de MyChart en el transcurso de 1 a 2  das hbiles.  Para renovar recetas, por favor pida a su farmacia que se ponga en contacto con nuestra oficina. Nuestro nmero de fax es el [phone].  Si tiene un asunto urgente cuando la clnica est cerrada y que no puede esperar hasta el siguiente da hbil, puede llamar/localizar a su doctor(a) al nmero que aparece a continuacin.   Por favor, tenga en cuenta que aunque hacemos todo lo posible para estar disponibles para asuntos urgentes fuera del horario de oficina, no estamos disponibles las 24 horas del da, los 7 das de la semana.   Si tiene un problema urgente y no puede comunicarse con nosotros, puede optar por buscar atencin mdica  en el consultorio de su doctor(a), en una clnica privada, en un centro de atencin urgente o en una sala de emergencias.  Si tiene una emergencia mdica, por favor llame inmediatamente al 911 o vaya a la sala de emergencias.  Nmeros de bper  - Dr. Kowalski: [phone]  - Dra. Moye: [phone]  - Dra. Stewart: [phone]  En caso de inclemencias del tiempo, por favor llame a nuestra lnea principal al [phone] para una actualizacin sobre el estado de cualquier retraso o cierre.  Consejos para la medicacin en dermatologa: Por favor, guarde las cajas en las que vienen los medicamentos de uso tpico para ayudarle a seguir las instrucciones sobre dnde y cmo usarlos. Las farmacias generalmente imprimen las instrucciones del medicamento slo en las cajas y no directamente en los tubos del medicamento.   Si su medicamento es muy caro, por favor, pngase en contacto con nuestra oficina llamando al [phone] y presione la opcin 4 o envenos un mensaje a travs de MyChart.   No podemos decirle cul ser su copago por los medicamentos por adelantado ya que esto es diferente dependiendo de la cobertura de su seguro. Sin embargo, es posible que podamos encontrar un medicamento sustituto a menor costo o llenar un formulario para que el  seguro cubra el medicamento que se considera necesario.   Si se requiere una autorizacin previa para que su compaa de seguros cubra su medicamento, por favor permtanos de 1 a 2 das hbiles para completar este proceso.  Los precios de los medicamentos varan con frecuencia dependiendo del lugar de dnde se surte la receta y alguna farmacias pueden ofrecer precios ms baratos.  El sitio web www.goodrx.com tiene cupones para medicamentos de diferentes farmacias. Los precios aqu no tienen en cuenta lo que podra costar con la ayuda del seguro (puede ser ms barato con su seguro), pero el sitio web puede darle el precio si no utiliz ningn seguro.  - Puede imprimir el cupn correspondiente y llevarlo con su receta a la farmacia.  - Tambin puede pasar por nuestra oficina durante el horario de atencin regular y recoger una tarjeta de cupones de GoodRx.  - Si necesita que su receta se enve electrnicamente a una farmacia diferente, informe a nuestra oficina a travs de MyChart de [place]   o por telfono llamando al [phone] y presione la opcin 4.  

## 2022-06-25 NOTE — Progress Notes (Unsigned)
Follow-Up Visit   Subjective  Benjamin Deleon is a 81 y.o. male who presents for the following: Annual Exam (Hx of dysplastic nevi - ). The patient presents for Total-Body Skin Exam (TBSE) for skin cancer screening and mole check.  The patient has spots, moles and lesions to be evaluated, some may be new or changing.  The following portions of the chart were reviewed this encounter and updated as appropriate:   Tobacco  Allergies  Meds  Problems  Med Hx  Surg Hx  Fam Hx     Review of Systems:  No other skin or systemic complaints except as noted in HPI or Assessment and Plan.  Objective  Well appearing patient in no apparent distress; mood and affect are within normal limits.  A full examination was performed including scalp, head, eyes, ears, nose, lips, neck, chest, axillae, abdomen, back, buttocks, bilateral upper extremities, bilateral lower extremities, hands, feet, fingers, toes, fingernails, and toenails. All findings within normal limits unless otherwise noted below.  L scalp/temple x 1 Erythematous stuck-on, waxy papule or plaque.   Assessment & Plan  Rash and other nonspecific skin eruption Feet Atopic dermatitis vs allergic contact dermatitis - patient with hx of PMR, + fhx of psoriasis in father  Consider patch testing in future.  Chronic and persistent condition with duration or expected duration over one year. Condition is bothersome/symptomatic for patient. Currently flared.  Mometasone 0.1% cream to aa's QD-BID PRN up to 5d/wk. Continue hypoallergenic socks. Topical steroids (such as triamcinolone, fluocinolone, fluocinonide, mometasone, clobetasol, halobetasol, betamethasone, hydrocortisone) can cause thinning and lightening of the skin if they are used for too long in the same area. Your physician has selected the right strength medicine for your problem and area affected on the body. Please use your medication only as directed by your physician to prevent side  effects.    Consider Eucrisa 2% ointment if not improving.   Consider patch testing in the future if condition becomes more significant and bothersome.   Inflamed seborrheic keratosis L scalp/temple x 1 Symptomatic, irritating, patient would like treated. Destruction of lesion - L scalp/temple x 1 Complexity: simple   Destruction method: cryotherapy   Informed consent: discussed and consent obtained   Timeout:  patient name, date of birth, surgical site, and procedure verified Lesion destroyed using liquid nitrogen: Yes   Region frozen until ice ball extended beyond lesion: Yes   Outcome: patient tolerated procedure well with no complications   Post-procedure details: wound care instructions given    Lentigines - Scattered tan macules - Due to sun exposure - Benign-appearing, observe - Recommend daily broad spectrum sunscreen SPF 30+ to sun-exposed areas, reapply every 2 hours as needed. - Call for any changes  Seborrheic Keratoses - Stuck-on, waxy, tan-brown papules and/or plaques  - Benign-appearing - Discussed benign etiology and prognosis. - Observe - Call for any changes  Melanocytic Nevi - Tan-brown and/or pink-flesh-colored symmetric macules and papules - Benign appearing on exam today - Observation - Call clinic for new or changing moles - Recommend daily use of broad spectrum spf 30+ sunscreen to sun-exposed areas.   Hemangiomas - Red papules - Discussed benign nature - Observe - Call for any changes  Actinic Damage - Chronic condition, secondary to cumulative UV/sun exposure - diffuse scaly erythematous macules with underlying dyspigmentation - Recommend daily broad spectrum sunscreen SPF 30+ to sun-exposed areas, reapply every 2 hours as needed.  - Staying in the shade or wearing long sleeves, sun glasses (UVA+UVB  protection) and wide brim hats (4-inch brim around the entire circumference of the hat) are also recommended for sun protection.  - Call for  new or changing lesions.  Dermatofibroma - L tricep  - Firm pink/brown papulenodule with dimple sign - Benign appearing - Call for any changes  Skin cancer screening performed today.  Return in about 1 year (around 06/26/2023) for TBSE.  [name], CMA, am acting as scribe for Sarina Ser, MD . Documentation: I have reviewed the above documentation for accuracy and completeness, and I agree with the above.  Sarina Ser, MD

## 2022-06-26 ENCOUNTER — Encounter: Payer: Self-pay | Admitting: Podiatry

## 2022-06-26 ENCOUNTER — Encounter: Payer: Self-pay | Admitting: Dermatology

## 2022-06-26 ENCOUNTER — Ambulatory Visit (INDEPENDENT_AMBULATORY_CARE_PROVIDER_SITE_OTHER): Payer: Medicare Other | Admitting: Podiatry

## 2022-06-26 DIAGNOSIS — Z862 Personal history of diseases of the blood and blood-forming organs and certain disorders involving the immune mechanism: Secondary | ICD-10-CM | POA: Diagnosis not present

## 2022-06-26 DIAGNOSIS — D696 Thrombocytopenia, unspecified: Secondary | ICD-10-CM | POA: Diagnosis not present

## 2022-06-26 DIAGNOSIS — B351 Tinea unguium: Secondary | ICD-10-CM

## 2022-06-26 NOTE — Progress Notes (Signed)
This patient returns to my office for at risk foot care.  This patient requires this care by a professional since this patient will be at risk due to having thrombocytopenia.  This patient is unable to cut nails himself since the patient cannot reach his nails.These nails are painful walking and wearing shoes.  This patient presents for at risk foot care today.  General Appearance  Alert, conversant and in no acute stress.  Vascular  Dorsalis pedis and posterior tibial  pulses are palpable  bilaterally.  Capillary return is within normal limits  bilaterally. Temperature is within normal limits  bilaterally.  Neurologic  Senn-Weinstein monofilament wire test within normal limits  bilaterally. Muscle power within normal limits bilaterally.  Nails Thick disfigured discolored nails with subungual debris  from hallux to fifth toes bilaterally. No evidence of bacterial infection or drainage bilaterally.  Orthopedic  No limitations of motion  feet .  No crepitus or effusions noted.  No bony pathology or digital deformities noted.  Skin  normotropic skin with no porokeratosis noted bilaterally.  No signs of infections or ulcers noted.     Onychomycosis  Pain in right toes  Pain in left toes  Consent was obtained for treatment procedures.   Mechanical debridement of nails 1-5  bilaterally performed with a nail nipper.  Filed with dremel without incident.    Return office visit    12  weeks.                 Told patient to return for periodic foot care and evaluation due to potential at risk complications.   Venice Liz DPM   

## 2022-08-18 ENCOUNTER — Ambulatory Visit: Payer: Medicare Other | Admitting: Urology

## 2022-09-03 ENCOUNTER — Ambulatory Visit: Payer: Medicare Other | Admitting: Urology

## 2022-09-25 ENCOUNTER — Ambulatory Visit (INDEPENDENT_AMBULATORY_CARE_PROVIDER_SITE_OTHER): Payer: Medicare Other | Admitting: Podiatry

## 2022-09-25 ENCOUNTER — Encounter: Payer: Self-pay | Admitting: Podiatry

## 2022-09-25 DIAGNOSIS — D696 Thrombocytopenia, unspecified: Secondary | ICD-10-CM | POA: Diagnosis not present

## 2022-09-25 DIAGNOSIS — B351 Tinea unguium: Secondary | ICD-10-CM

## 2022-09-25 DIAGNOSIS — Z862 Personal history of diseases of the blood and blood-forming organs and certain disorders involving the immune mechanism: Secondary | ICD-10-CM | POA: Diagnosis not present

## 2022-09-25 NOTE — Progress Notes (Signed)
This patient returns to my office for at risk foot care.  This patient requires this care by a professional since this patient will be at risk due to having thrombocytopenia.  This patient is unable to cut nails himself since the patient cannot reach his nails.These nails are painful walking and wearing shoes.  This patient presents for at risk foot care today.  General Appearance  Alert, conversant and in no acute stress.  Vascular  Dorsalis pedis and posterior tibial  pulses are palpable  bilaterally.  Capillary return is within normal limits  bilaterally. Temperature is within normal limits  bilaterally.  Neurologic  Senn-Weinstein monofilament wire test within normal limits  bilaterally. Muscle power within normal limits bilaterally.  Nails Thick disfigured discolored nails with subungual debris  from hallux to fifth toes bilaterally. No evidence of bacterial infection or drainage bilaterally.  Orthopedic  No limitations of motion  feet .  No crepitus or effusions noted.  No bony pathology or digital deformities noted.  Skin  normotropic skin with no porokeratosis noted bilaterally.  No signs of infections or ulcers noted.     Onychomycosis  Pain in right toes  Pain in left toes  Consent was obtained for treatment procedures.   Mechanical debridement of nails 1-5  bilaterally performed with a nail nipper.  Filed with dremel without incident.    Return office visit    12  weeks.                 Told patient to return for periodic foot care and evaluation due to potential at risk complications.   Gardiner Barefoot DPM

## 2022-10-21 ENCOUNTER — Other Ambulatory Visit: Payer: Self-pay | Admitting: Dermatology

## 2022-10-21 DIAGNOSIS — L2081 Atopic neurodermatitis: Secondary | ICD-10-CM

## 2023-01-01 ENCOUNTER — Ambulatory Visit: Payer: Medicare Other | Admitting: Podiatry

## 2023-01-08 ENCOUNTER — Ambulatory Visit (INDEPENDENT_AMBULATORY_CARE_PROVIDER_SITE_OTHER): Payer: Medicare Other | Admitting: Dermatology

## 2023-01-08 ENCOUNTER — Encounter: Payer: Self-pay | Admitting: Dermatology

## 2023-01-08 VITALS — BP 134/75

## 2023-01-08 DIAGNOSIS — L578 Other skin changes due to chronic exposure to nonionizing radiation: Secondary | ICD-10-CM | POA: Diagnosis not present

## 2023-01-08 DIAGNOSIS — D224 Melanocytic nevi of scalp and neck: Secondary | ICD-10-CM | POA: Diagnosis not present

## 2023-01-08 DIAGNOSIS — B078 Other viral warts: Secondary | ICD-10-CM

## 2023-01-08 DIAGNOSIS — D492 Neoplasm of unspecified behavior of bone, soft tissue, and skin: Secondary | ICD-10-CM

## 2023-01-08 MED ORDER — GENTAMICIN SULFATE 0.1 % EX OINT: 1.0000 | TOPICAL_OINTMENT | Freq: Two times a day (BID) | CUTANEOUS | 0 refills | Status: DC

## 2023-01-08 NOTE — Progress Notes (Signed)
Follow-Up Visit   Subjective  Benjamin Deleon is a 82 y.o. male who presents for the following: Spot at top of scalp and a  growth inside of left ear, present for months and if patient rubs with it the spot gets bigger and bleeds.   The patient has spots, moles and lesions to be evaluated, some may be new or changing and the patient has concerns that these could be cancer.   The following portions of the chart were reviewed this encounter and updated as appropriate: medications, allergies, medical history  Review of Systems:  No other skin or systemic complaints except as noted in HPI or Assessment and Plan.  Objective  Well appearing patient in no apparent distress; mood and affect are within normal limits.  A focused examination was performed of the following areas: Ears, scalp Relevant physical exam findings are noted in the Assessment and Plan.  left concha bowl 0.35 cm pink papule R/o Verruca > SCC or other     mid frontal scalp 0.3 cm medium to dark brown thin papule R/o Atypia         Assessment & Plan   Neoplasm of skin (2) left concha bowl  Epidermal / dermal shaving  Lesion diameter (cm):  0.4 Informed consent: discussed and consent obtained   Timeout: patient name, date of birth, surgical site, and procedure verified   Anesthesia: the lesion was anesthetized in a standard fashion   Anesthetic:  1% lidocaine w/ epinephrine 1-100,000 local infiltration Instrument used: scissors   Instrument used comment:  Forceps Hemostasis achieved with: aluminum chloride   Outcome: patient tolerated procedure well   Post-procedure details: wound care instructions given   Additional details:  Mupirocin and a bandage applied  Specimen 1 - Surgical pathology Differential Diagnosis: R/o Verruca > SCC or other  Check Margins: No 0.35 cm pink papule   mid frontal scalp  Epidermal / dermal shaving  Lesion diameter (cm):  0.3 Informed consent: discussed and  consent obtained   Timeout: patient name, date of birth, surgical site, and procedure verified   Anesthesia: the lesion was anesthetized in a standard fashion   Anesthetic:  1% lidocaine w/ epinephrine 1-100,000 local infiltration Instrument used: flexible razor blade   Hemostasis achieved with: aluminum chloride   Outcome: patient tolerated procedure well   Post-procedure details: wound care instructions given   Additional details:  Mupirocin and a bandage applied  Specimen 2 - Surgical pathology Differential Diagnosis: R/o Atypia  Check Margins: No 0.3 cm medium to dark brown thin papule   Start gentamicin twice daily to affected areas.  Recommend [facility] Hypochlorous Spray.   MELANOCYTIC NEVI Benign appearing on exam today. Recommend observation. Call clinic for new or changing moles. Recommend daily use of broad spectrum spf 30+ sunscreen to sun-exposed areas.  Tan-brown and/or pink-flesh-colored symmetric macules and papules Exam Tan-brown and/or pink-flesh-colored symmetric macules and papules 0.3 cm medium to light brown thin papule at mid vertex scalp. Monitor and consider biopsy if changing.    ACTINIC DAMAGE - chronic, secondary to cumulative UV radiation exposure/sun exposure over time - diffuse scaly erythematous macules with underlying dyspigmentation - Recommend daily broad spectrum sunscreen SPF 30+ to sun-exposed areas, reapply every 2 hours as needed.  - Recommend staying in the shade or wearing long sleeves, sun glasses (UVA+UVB protection) and wide brim hats (4-inch brim around the entire circumference of the hat). - Call for new or changing lesions. - Recommend taking Heliocare sun protection supplement daily in  sunny weather for additional sun protection. For maximum protection on the sunniest days, you can take up to 2 capsules of regular Heliocare OR take 1 capsule of Heliocare Ultra.   Return for TBSE, as scheduled, Hx Dysplastic Nevi.  Anise SalvoI,Jackie  Stephens, RMA, am acting as scribe for Darden DatesVIRGINA Marisol Glazer, MD .   Documentation: I have reviewed the above documentation for accuracy and completeness, and I agree with the above.  Darden DatesVIRGINA Deona Novitski, MD

## 2023-01-08 NOTE — Patient Instructions (Addendum)
Start gentamicin twice daily to affected areas.  Recommend [facility] Hypochlorous Spray.   Recommend taking Heliocare sun protection supplement daily in sunny weather for additional sun protection. For maximum protection on the sunniest days, you can take up to 2 capsules of regular Heliocare OR take 1 capsule of Heliocare Ultra. For prolonged exposure (such as a full day in the sun), you can repeat your dose of the supplement 4 hours after your first dose. Heliocare can be purchased at [employer], at some [facility] or at [email or web address].     Wound Care Instructions  Cleanse wound gently with soap and water once a day then pat dry with clean gauze. Apply a thin coat of Petrolatum (petroleum jelly, "Vaseline") over the wound (unless you have an allergy to this). We recommend that you use a new, sterile tube of Vaseline. Do not pick or remove scabs. Do not remove the yellow or white "healing tissue" from the base of the wound.  Cover the wound with fresh, clean, nonstick gauze and secure with paper tape. You may use Band-Aids in place of gauze and tape if the wound is small enough, but would recommend trimming much of the tape off as there is often too much. Sometimes Band-Aids can irritate the skin.  You should call the office for your biopsy report after 1 week if you have not already been contacted.  If you experience any problems, such as abnormal amounts of bleeding, swelling, significant bruising, significant pain, or evidence of infection, please call the office immediately.  FOR ADULT SURGERY PATIENTS: If you need something for pain relief you may take 1 extra strength Tylenol (acetaminophen) AND 2 Ibuprofen (200mg  each) together every 4 hours as needed for pain. (do not take these if you are allergic to them or if you have a reason you should not take them.) Typically, you may only need pain medication for 1 to 3 days.   Due to recent changes in healthcare laws, you may see  results of your pathology and/or laboratory studies on MyChart before the doctors have had a chance to review them. We understand that in some cases there may be results that are confusing or concerning to you. Please understand that not all results are received at the same time and often the doctors may need to interpret multiple results in order to provide you with the best plan of care or course of treatment. Therefore, we ask that you please give [place] 2 business days to thoroughly review all your results before contacting the office for clarification. Should we see a critical lab result, you will be contacted sooner.   If You Need Anything After Your Visit  If you have any questions or concerns for your doctor, please call our main line at [phone] and press option 4 to reach your doctor's medical assistant. If no one answers, please leave a voicemail as directed and we will return your call as soon as possible. Messages left after 4 pm will be answered the following business day.   You may also send [place] a message via [place]. We typically respond to MyChart messages within 1-2 business days.  For prescription refills, please ask your pharmacy to contact our office. Our fax number is [phone].  If you have an urgent issue when the clinic is closed that cannot wait until the next business day, you can page your doctor at the number below.    Please note that while we do our best to be available  for urgent issues outside of office hours, we are not available 24/7.   If you have an urgent issue and are unable to reach [place], you may choose to seek medical care at your doctor's office, retail clinic, urgent care center, or emergency room.  If you have a medical emergency, please immediately call 911 or go to the emergency department.  Pager Numbers  - Dr. Nehemiah Massed: [phone]  - Dr. Laurence Ferrari: [phone]  - Dr. Nicole Kindred: [phone]  In the event of inclement weather, please call our main  line at [phone] for an update on the status of any delays or closures.  Dermatology Medication Tips: Please keep the boxes that topical medications come in in order to help keep track of the instructions about where and how to use these. Pharmacies typically print the medication instructions only on the boxes and not directly on the medication tubes.   If your medication is too expensive, please contact our office at [phone] option 4 or send [place] a message through [place].   We are unable to tell what your co-pay for medications will be in advance as this is different depending on your insurance coverage. However, we may be able to find a substitute medication at lower cost or fill out paperwork to get insurance to cover a needed medication.   If a prior authorization is required to get your medication covered by your insurance company, please allow [place] 1-2 business days to complete this process.  Drug prices often vary depending on where the prescription is filled and some pharmacies may offer cheaper prices.  The website www.goodrx.com contains coupons for medications through different pharmacies. The prices here do not account for what the cost may be with help from insurance (it may be cheaper with your insurance), but the website can give you the price if you did not use any insurance.  - You can print the associated coupon and take it with your prescription to the pharmacy.  - You may also stop by our office during regular business hours and pick up a GoodRx coupon card.  - If you need your prescription sent electronically to a different pharmacy, notify our office through [facility] or by phone at [phone] option 4.     Si Usted Necesita Algo Despus de Su Visita  Tambin puede enviarnos un mensaje a travs de Pharmacist, community. Por lo general respondemos a los mensajes de MyChart en el transcurso de 1 a 2 das hbiles.  Para renovar recetas, por favor pida a su farmacia que  se ponga en contacto con nuestra oficina. Harland Dingwall de fax es [place] [phone].  Si tiene un asunto urgente cuando la clnica est cerrada y que no puede esperar hasta el siguiente da hbil, puede llamar/localizar a su doctor(a) al nmero que aparece a continuacin.   Por favor, tenga en cuenta que aunque hacemos todo lo posible para estar disponibles para asuntos urgentes fuera del horario de [place], no estamos disponibles las 24 horas del da, los 7 das de la [place].   Si tiene un problema urgente y no puede comunicarse con nosotros, puede optar por buscar atencin mdica  en el consultorio de su doctor(a), en una clnica privada, en un centro de atencin urgente o en una sala de emergencias.  Si tiene Engineering geologist, por favor llame inmediatamente al 911 o vaya a la sala de emergencias.  Nmeros de bper  - Dr. Nehemiah Massed: [phone]  - Dra. Moye: [phone]  - Dra. Nicole Kindred: [phone]  En caso de inclemencias del [place], por favor llame a nuestra lnea principal al [phone] para una actualizacin sobre el [place] de cualquier retraso o cierre.  Consejos para la medicacin en dermatologa: Por favor, guarde las cajas en las que vienen los medicamentos de uso tpico para ayudarle a seguir las instrucciones sobre dnde y cmo usarlos. Las farmacias generalmente imprimen las instrucciones del medicamento slo en las cajas y no directamente en los tubos del [place].   Si su medicamento es muy caro, por favor, pngase en contacto con Zigmund Daniel llamando al [phone] y presione la opcin 4 o envenos un mensaje a travs de Pharmacist, community.   No podemos decirle cul ser su copago por los medicamentos por adelantado ya que esto es diferente dependiendo de la cobertura de su seguro. Sin embargo, es posible que podamos encontrar un medicamento sustituto a Electrical engineer un formulario para que el seguro cubra el medicamento que se considera necesario.   Si se  requiere una autorizacin previa para que su compaa de seguros [place] su medicamento, por favor permtanos de 1 a 2 das hbiles para completar este proceso.  Los precios de los medicamentos varan con frecuencia dependiendo del Environmental consultant de dnde se surte la receta y alguna farmacias pueden ofrecer precios ms baratos.  El sitio web www.goodrx.com tiene cupones para medicamentos de Airline pilot. Los precios aqu no tienen en cuenta lo que podra costar con la ayuda del seguro (puede ser ms barato con su seguro), pero el sitio web puede darle el precio si no utiliz Research scientist (physical sciences).  - Puede imprimir el cupn correspondiente y llevarlo con su receta a la farmacia.  - Tambin puede pasar por nuestra oficina durante el horario de atencin regular y Charity fundraiser una tarjeta de cupones de GoodRx.  - Si necesita que su receta se enve electrnicamente a una farmacia diferente, informe a nuestra oficina a travs de MyChart de [place] o por telfono llamando al [phone] y presione la opcin 4.

## 2023-01-15 ENCOUNTER — Telehealth: Payer: Self-pay

## 2023-01-15 NOTE — Telephone Encounter (Signed)
Discussed pathology results. Patient voiced understanding.  

## 2023-01-15 NOTE — Telephone Encounter (Signed)
-----   Message from Sandi Mealy, MD sent at 01/15/2023  1:36 PM EDT ----- 1. Skin , left concha bowl VERRUCA VULGARIS, INFLAMED --> This is a WART caused by the human papilloma virus. It is not dangerous but is contagious and can spread to other areas of skin or other people if it is not completely gone. No additional treatment is needed. However, if it comes back, we can freeze it in clinic with liquid nitrogen (a quick in office procedure) or you can also treat it at home with an over the counter salicylic wart treatment (slower).  2. 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 Gwen Pounds  This is a MODERATELY ATYPICAL MOLE. On the spectrum from normal mole to melanoma skin cancer, this is in between the two. - We need to recheck this area sometime in the next 6 months to be sure there is no evidence of the atypical mole coming back. If there is any color coming back, we would recommend repeating the biopsy to be sure the cells look normal.  - People who have a history of atypical moles do have a slightly increased risk of developing melanoma somewhere on the body, so a yearly full body skin exam by a dermatologist is recommended.  - Monthly self skin checks and daily sun protection are also recommended.  - Please call if you notice a dark spot coming back where this biopsy was taken.  - Please also call if you notice any new or changing spots anywhere else on the body before your follow-up visit.     MAs please call. Thank you!

## 2023-01-20 ENCOUNTER — Encounter: Payer: Self-pay | Admitting: Podiatry

## 2023-01-26 ENCOUNTER — Ambulatory Visit: Payer: Medicare Other | Admitting: Podiatry

## 2023-01-29 ENCOUNTER — Ambulatory Visit (INDEPENDENT_AMBULATORY_CARE_PROVIDER_SITE_OTHER): Payer: Medicare Other | Admitting: Podiatry

## 2023-01-29 ENCOUNTER — Encounter: Payer: Self-pay | Admitting: Podiatry

## 2023-01-29 DIAGNOSIS — B351 Tinea unguium: Secondary | ICD-10-CM | POA: Diagnosis not present

## 2023-01-29 NOTE — Progress Notes (Signed)
This patient returns to my office for at risk foot care.  This patient requires this care by a professional since this patient will be at risk due to having thrombocytopenia.  This patient is unable to cut nails himself since the patient cannot reach his nails.These nails are painful walking and wearing shoes.  This patient presents for at risk foot care today.  General Appearance  Alert, conversant and in no acute stress.  Vascular  Dorsalis pedis and posterior tibial  pulses are palpable  bilaterally.  Capillary return is within normal limits  bilaterally. Temperature is within normal limits  bilaterally.  Neurologic  Senn-Weinstein monofilament wire test within normal limits  bilaterally. Muscle power within normal limits bilaterally.  Nails Thick disfigured discolored nails with subungual debris  hallux nails  B/L. No evidence of bacterial infection or drainage bilaterally.  Orthopedic  No limitations of motion  feet .  No crepitus or effusions noted.  No bony pathology or digital deformities noted.  Skin  normotropic skin with no porokeratosis noted bilaterally.  No signs of infections or ulcers noted.     Onychomycosis  Pain in right toes  Pain in left toes  Consent was obtained for treatment procedures.   Mechanical debridement of nails 1-5  bilaterally performed with a nail nipper.  Filed with dremel without incident.    Return office visit    4 months                 Told patient to return for periodic foot care and evaluation due to potential at risk complications.   Helane Gunther DPM

## 2023-02-18 ENCOUNTER — Encounter: Payer: Self-pay | Admitting: Urology

## 2023-02-18 ENCOUNTER — Ambulatory Visit (INDEPENDENT_AMBULATORY_CARE_PROVIDER_SITE_OTHER): Payer: Medicare Other | Admitting: Urology

## 2023-02-18 VITALS — BP 128/75 | HR 69 | Ht 72.0 in | Wt 180.0 lb

## 2023-02-18 DIAGNOSIS — N312 Flaccid neuropathic bladder, not elsewhere classified: Secondary | ICD-10-CM

## 2023-02-18 DIAGNOSIS — R339 Retention of urine, unspecified: Secondary | ICD-10-CM | POA: Diagnosis not present

## 2023-02-18 NOTE — Progress Notes (Signed)
I, Benjamin Deleon, acting as a scribe for Benjamin Altes, MD., have documented all relevant documentation on the behalf of Benjamin Altes, MD, as directed by  Benjamin Altes, MD while in the presence of Benjamin Altes, MD.   02/18/2023 2:35 PM   Benjaman Deleon [birth year] [id number]  Referring provider: Kandyce Rud, MD [phone] 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 male presents for annual follow-up.  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 Last visit complaining of polyuria and nocturia. He had a sleep study, which showed no sleep apnea. He was referred to nephrology for evaluation of polyuria. He did not see nephrology, with resolution of his symptoms occurring when he decreased his fluid intake.   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 Mild atypia, limited margins free.   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 deltoid. Mild atypia, limited margins free.   Dysplastic nevus 06/24/2021   right mid back 3 cm lateral to spine, mod atypia   Dysplastic nevus 01/08/2023   Mid frontal scalp. Moderate atypia, superimposed on SK, margins free.   GERD (gastroesophageal reflux disease)    Polymyalgia rheumatica (HCC)    Sleep apnea    uses dental appliance   Wears hearing aid in both ears     Surgical History: Past Surgical History:  Procedure Laterality Date   CATARACT EXTRACTION W/PHACO Right 02/22/2020   Procedure: CATARACT EXTRACTION PHACO AND INTRAOCULAR LENS PLACEMENT (IOC) RIGHT 14.63 01:24.4 17.3%;  Surgeon: Lockie Mola, MD;  Location: [facility];  Service: Ophthalmology;  Laterality: Right;  sleep apnea   CATARACT EXTRACTION  W/PHACO Left 03/21/2020   Procedure: CATARACT EXTRACTION PHACO AND INTRAOCULAR LENS PLACEMENT (IOC) LEFT 8.85  01:05.1  13.6%;  Surgeon: Lockie Mola, MD;  Location: [facility];  Service: Ophthalmology;  Laterality: Left;   COLONOSCOPY      Home Medications:  Allergies as of 02/18/2023       Reactions   Lactose Other (See Comments)   Gas, bloating   Lactose Intolerance (gi) Diarrhea   Bloating    Wound Dressing Adhesive Itching   Molnupiravir Rash        Medication List        Accurate as of Feb 18, 2023  2:35 PM. If you have any questions, ask your nurse or doctor.          amitriptyline 10 MG tablet Commonly known as: ELAVIL Take by mouth.   diclofenac Sodium 1 % Gel Commonly known as: VOLTAREN APPLY TOPICALLY 3 TIMES A  DAY AS NEEDED   famotidine 10 MG tablet Commonly known as: PEPCID Take 5 mg by mouth 2 (two) times daily.   finasteride 5 MG tablet Commonly known as: PROSCAR Take 1 tablet by mouth daily.   gentamicin ointment 0.1 % Commonly known as: GARAMYCIN Apply 1 Application topically in the morning and at bedtime.   ketoconazole 2 % cream Commonly known as: NIZORAL Apply 1 application topically at bedtime. Apply to entire foot, in between toes, side of foot (apply to both feet) QHS.   Lagevrio 200 MG Caps capsule Generic drug: molnupiravir EUA Take 4 capsules by mouth 2 (two) times  daily.   methotrexate 2.5 MG tablet Commonly known as: RHEUMATREX Take by mouth.   mometasone 0.1 % cream Commonly known as: ELOCON APPLY 1 APPLICATION. TOPICALLY AS DIRECTED. APPLY TO LOWER LEGS, ANKLES AND TOPS OF FEET EVERY DAY 5 DAYS A WEEK UNTIL CLEAR, THEN AS NEEDED FLARES   simvastatin 5 MG tablet Commonly known as: ZOCOR Take 5 mg by mouth daily.   Vitamin D3 50 MCG (2000 UT) Tabs Take by mouth daily.        Allergies:  Allergies  Allergen Reactions   Lactose Other (See Comments)    Gas, bloating   Lactose Intolerance (Gi)  Diarrhea    Bloating    Wound Dressing Adhesive Itching   Molnupiravir Rash    Family History: Family History  Problem Relation Age of Onset   Bladder Cancer Father     Social History:  reports that he has never smoked. He has never used smokeless tobacco. He reports that he does not currently use alcohol. He reports that he does not use drugs.   Physical Exam: BP 128/75   Pulse 69   Ht 6' (1.829 m)   Wt 180 lb (81.6 kg)   BMI 24.41 kg/m   Constitutional:  Alert and oriented, No acute distress. HEENT: [place] AT, moist mucus membranes.  Trachea midline, no masses. Respiratory: Normal respiratory effort, no increased work of breathing. Psychiatric: Normal mood and affect.   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 He inquired if he can perform CIC indefinitely, and was informed as long as he has the manual dexterity it is fine to do indefinitely. Other options were discussed, including suprapubic tube placement. Continue annual follow up.  I have reviewed the above documentation for accuracy and completeness, and I agree with the above.   Benjamin Altes, MD  [facility] [address], [address] [place], [place] [address] [phone]

## 2023-02-20 ENCOUNTER — Encounter: Payer: Self-pay | Admitting: Urology

## 2023-03-01 IMAGING — MG DIGITAL DIAGNOSTIC BILAT W/ TOMO W/ CAD
6 of 12 series · 6 of 36 positions shown · non-contrast
Comparison: None.

CLINICAL DATA: Palpable painful RIGHT breast lump

EXAM:
DIGITAL DIAGNOSTIC BILATERAL MAMMOGRAM WITH TOMOSYNTHESIS AND CAD
TECHNIQUE: Bilateral digital diagnostic mammography and breast tomosynthesis
was performed. The images were evaluated with computer-aided
detection.

[R CC synth-2D]
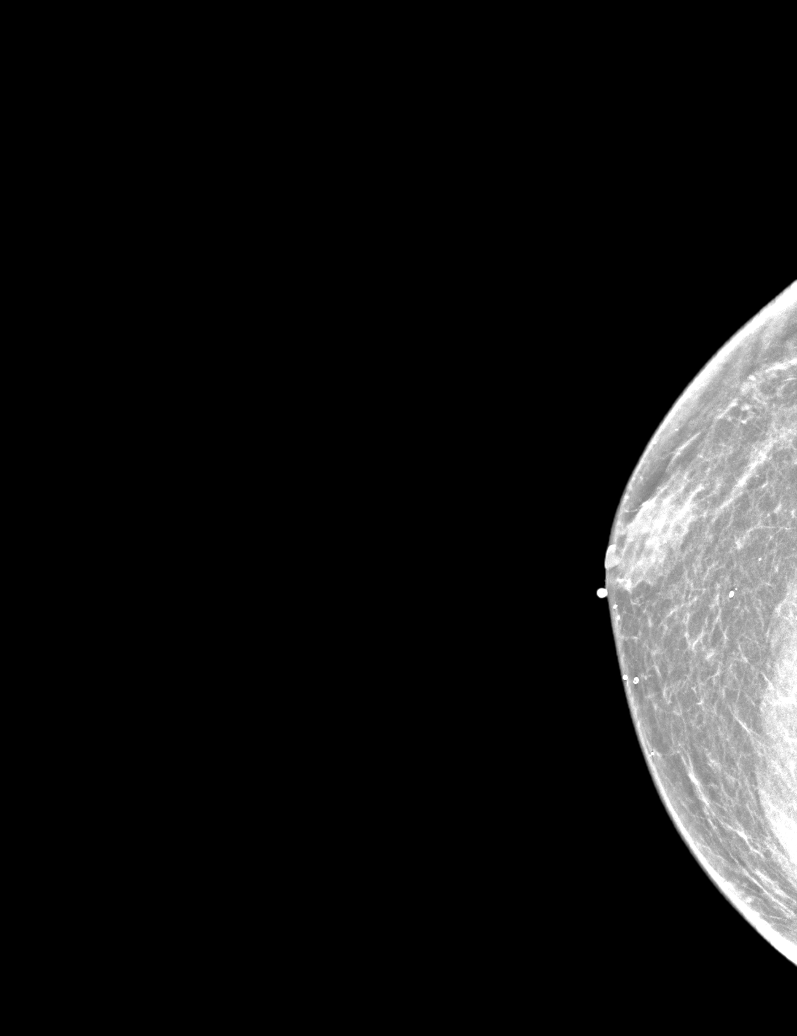

[L MLO synth-2D]
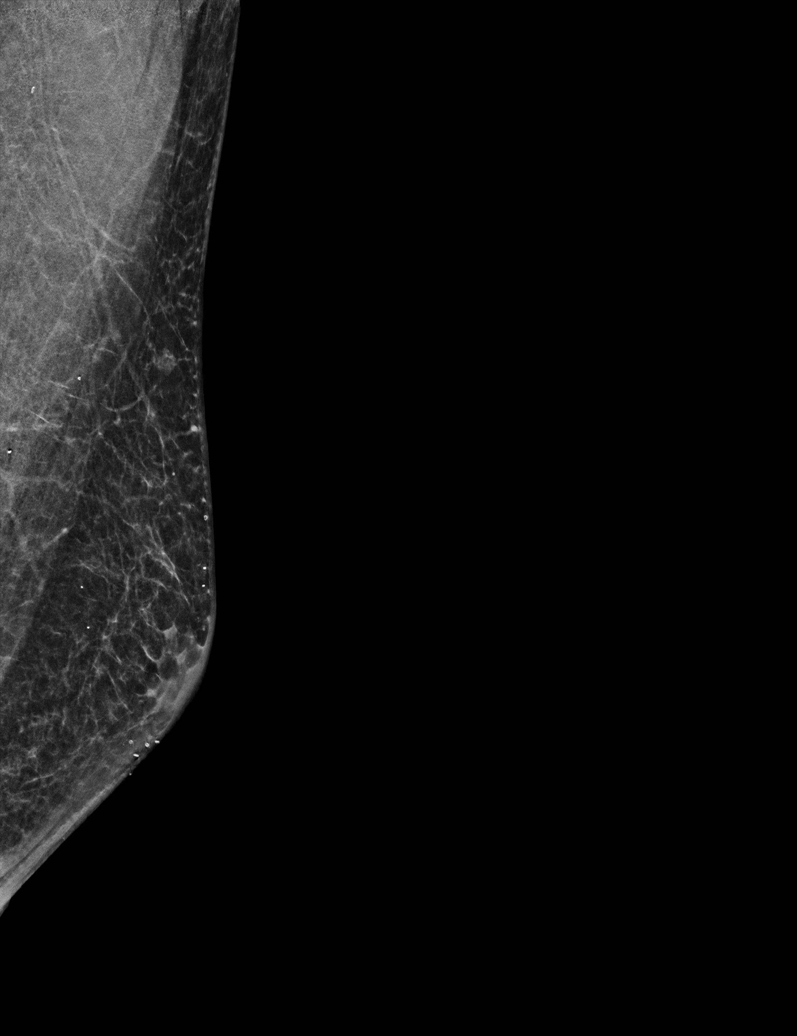

[L CC synth-2D (1 of 2)]
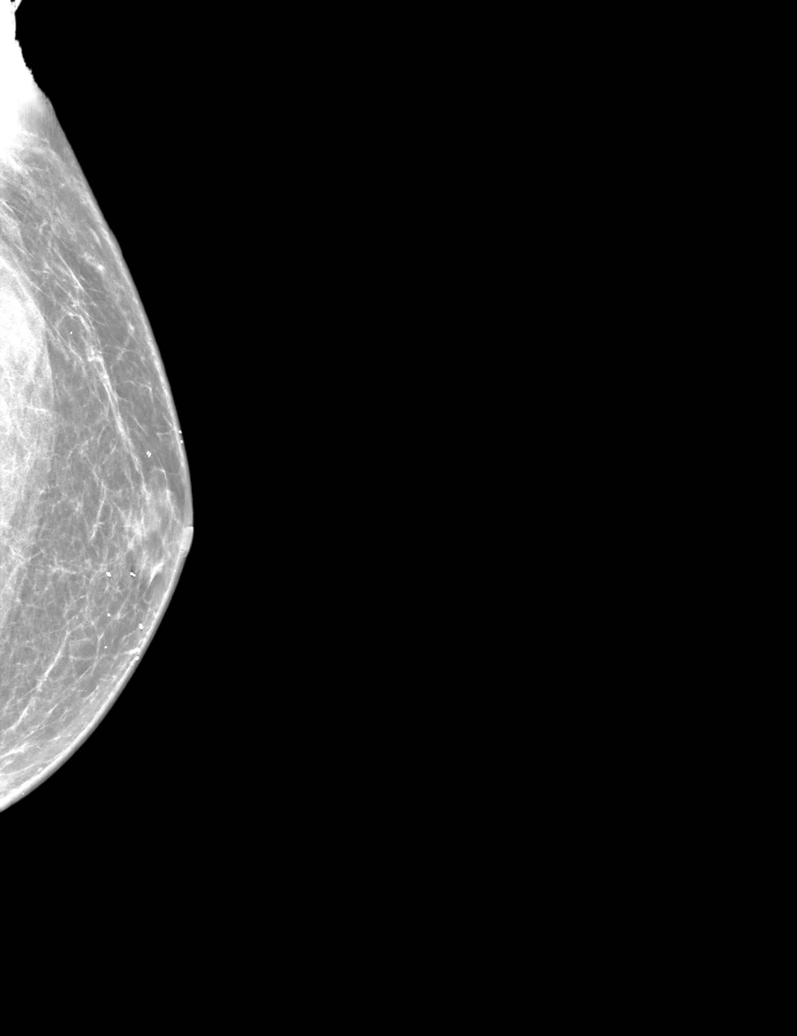

[L CC synth-2D (2 of 2)]
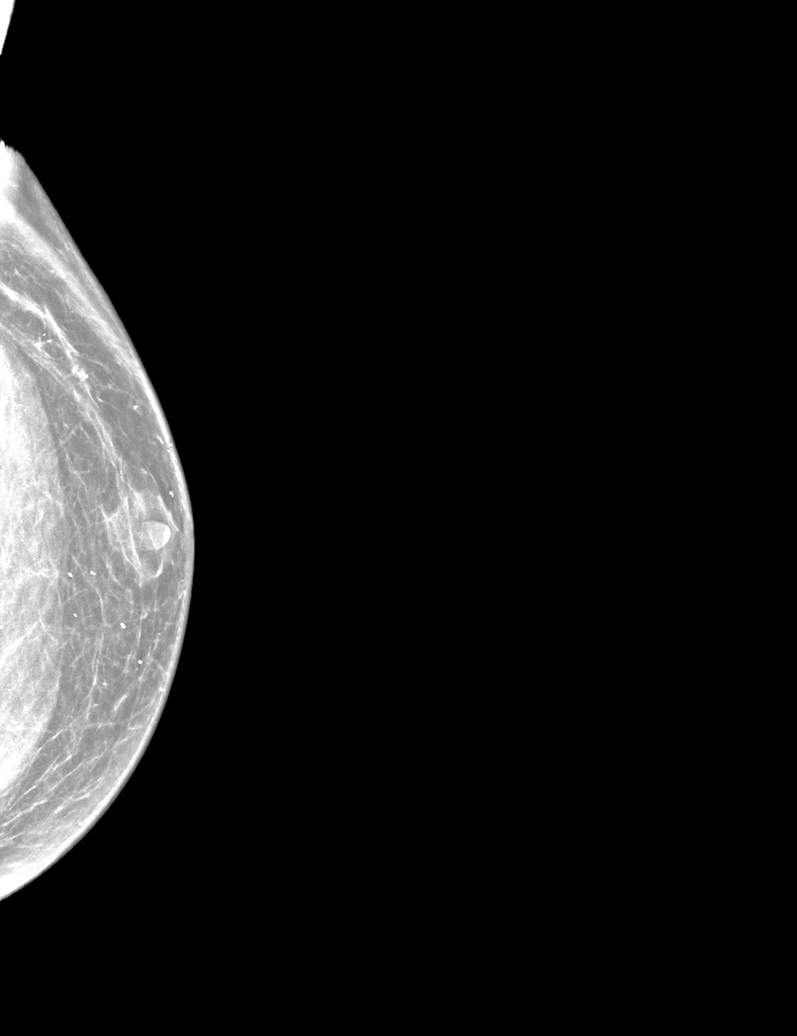

[R MLO synth-2D]
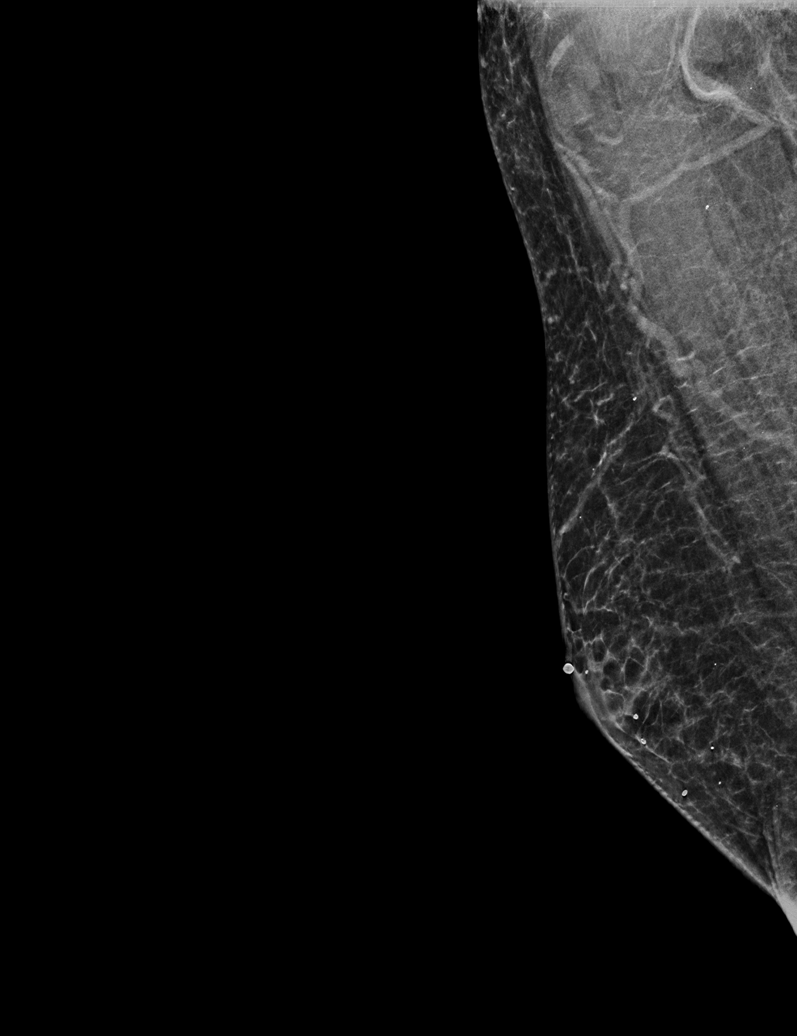

[R TAN synth-2D]
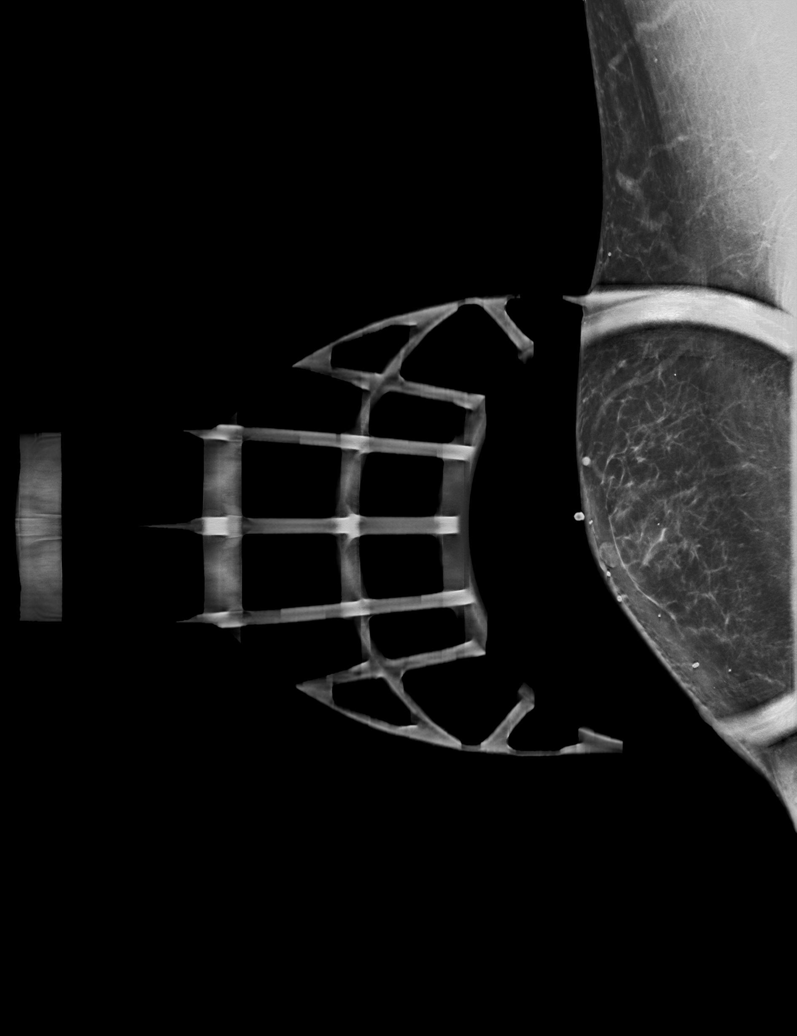

[6 of 36 positions shown; findings below may reference images not displayed]

ACR Breast Density Category b: There are scattered areas of
fibroglandular density.
FINDINGS: Diagnostic images obtained of the RIGHT breast demonstrated flame
shaped focal asymmetry in the RIGHT retroareolar breast. This is
consistent with mild to moderate RIGHT gynecomastia. There is mild
LEFT gynecomastia. No suspicious mass, distortion, or
microcalcifications are identified to suggest presence of
malignancy.
IMPRESSION: There is mild-to-moderate RIGHT and mild LEFT gynecomastia. No
mammographic evidence of malignancy bilaterally.

RECOMMENDATION:
No additional dedicated imaging is recommended for this benign
finding.

Gynecomastia is common and can occur with changes in the
testosterone:estrogen ratio. Potential causes of gynecomastia
include numerous prescription medications, dietary supplements,
anabolic steroids, and recreational drugs, particularly marijuana.
Other causes include hormone secreting testicular or pituitary
tumors. Gynecomastia can be related to other medical problems, such
as kidney, thyroid, or liver disease. Gynecomastia often resolves on
its own.

I have discussed the findings and recommendations with the patient.
If applicable, a reminder letter will be sent to the patient
regarding the next appointment.

BI-RADS CATEGORY  2: Benign.

## 2023-03-31 ENCOUNTER — Ambulatory Visit (INDEPENDENT_AMBULATORY_CARE_PROVIDER_SITE_OTHER): Payer: Medicare Other | Admitting: Dermatology

## 2023-03-31 VITALS — BP 150/82

## 2023-03-31 DIAGNOSIS — Z86018 Personal history of other benign neoplasm: Secondary | ICD-10-CM | POA: Diagnosis not present

## 2023-03-31 DIAGNOSIS — L820 Inflamed seborrheic keratosis: Secondary | ICD-10-CM | POA: Diagnosis not present

## 2023-03-31 DIAGNOSIS — L578 Other skin changes due to chronic exposure to nonionizing radiation: Secondary | ICD-10-CM

## 2023-03-31 DIAGNOSIS — L219 Seborrheic dermatitis, unspecified: Secondary | ICD-10-CM | POA: Diagnosis not present

## 2023-03-31 DIAGNOSIS — L409 Psoriasis, unspecified: Secondary | ICD-10-CM | POA: Diagnosis not present

## 2023-03-31 DIAGNOSIS — L853 Xerosis cutis: Secondary | ICD-10-CM

## 2023-03-31 DIAGNOSIS — Z7189 Other specified counseling: Secondary | ICD-10-CM

## 2023-03-31 DIAGNOSIS — Z79899 Other long term (current) drug therapy: Secondary | ICD-10-CM

## 2023-03-31 MED ORDER — MOMETASONE FUROATE 0.1 % EX CREA: 1.0000 | TOPICAL_CREAM | CUTANEOUS | 3 refills | Status: DC

## 2023-03-31 MED ORDER — MOMETASONE FUROATE 0.1 % EX SOLN: CUTANEOUS | 0 refills | Status: AC

## 2023-03-31 NOTE — Progress Notes (Unsigned)
Follow-Up Visit   Subjective  Benjamin Deleon is a 82 y.o. 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  The following portions of the chart were reviewed this encounter and updated as appropriate: medications, allergies, medical history  Review of Systems:  No other skin or systemic complaints except as noted in HPI or Assessment and Plan.  Objective  Well appearing patient in no apparent distress; mood and affect are within normal limits.  A focused examination was performed of the following areas: scalp  Relevant exam findings are noted in the Assessment and Plan.  post crown scalp x 1 Stuck on waxy paps with erythema   Assessment & Plan   HISTORY OF DYSPLASTIC NEVUS No evidence of recurrence today Recommend regular full body skin exams Recommend daily broad spectrum sunscreen SPF 30+ to sun-exposed areas, reapply every 2 hours as needed.  Call if any new or changing lesions are noted between office visits  - Mid frontal scalp clear  SEBORRHEIC DERMATITIS Exam: erythema scalp  Seborrheic Dermatitis is a chronic persistent rash characterized by pinkness and scaling most commonly of the mid face but also can occur on the scalp (dandruff), ears; mid chest, mid back and groin.  It tends to be exacerbated by stress and cooler weather.  People who have neurologic disease may experience new onset or exacerbation of existing seborrheic dermatitis.  The condition is not curable but treatable and can be controlled. Treatment Plan: Start Mometasone sol 3d/wk Monday, Wednesday, Friday to aa scalp   Topical steroids (such as triamcinolone, fluocinolone, fluocinonide, mometasone, clobetasol, halobetasol,  betamethasone, hydrocortisone) can cause thinning and lightening of the skin if they are used for too long in the same area. Your physician has selected the right strength medicine for your problem and area affected on the body. Please use your medication only as directed by your physician to prevent side effects.    PSORIASIS Exam: psoriatic plaques elbows 2% BSA. patient denies joint pain Psoriasis is a chronic non-curable, but treatable genetic/hereditary disease that may have other systemic features affecting other organ systems such as joints (Psoriatic Arthritis). It is associated with an increased risk of inflammatory bowel disease, heart disease, non-alcoholic fatty liver disease, and depression.  Treatments include light and laser treatments; topical medications; and systemic medications including oral and injectables. Treatment Plan: Start Mometasone cr every day up to 5d/wk to elbows until clear, then prn flares Start Dove for sensitive skin soap Start Cerave cream daily  Topical steroids (such as triamcinolone, fluocinolone, fluocinonide, mometasone, clobetasol, halobetasol, betamethasone, hydrocortisone) can cause thinning and lightening of the skin if they are used for too long in the same area. Your physician has selected the right strength medicine for your problem and area affected on the body. Please use your medication only as directed by your physician to prevent side effects.    Xerosis - diffuse xerotic patches - recommend gentle, hydrating skin care - gentle skin care handout given  - Recommend Dove sensitive skin soap, Cerave cream  If persistent may consider Atopic with Pruritus and Dupixent  Inflamed seborrheic keratosis post crown scalp x 1  Symptomatic, irritating, patient would like treated.   Destruction of lesion - post  crown scalp x 1 Complexity: simple   Destruction method: cryotherapy   Informed consent: discussed and consent obtained   Timeout:   patient name, date of birth, surgical site, and procedure verified Lesion destroyed using liquid nitrogen: Yes   Region frozen until ice ball extended beyond lesion: Yes   Outcome: patient tolerated procedure well with no complications   Post-procedure details: wound care instructions given     Return in about 3 months (around 07/01/2023) for seb derm, xerosis, psoriasis.  I, Ardis Rowan, RMA, am acting as scribe for Armida Sans, MD .  Documentation: I have reviewed the above documentation for accuracy and completeness, and I agree with the above.  Armida Sans, MD

## 2023-03-31 NOTE — Patient Instructions (Addendum)
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 We understand that in some cases there may be results that are confusing or concerning to you. Please understand that not all results are received at the same time and often the doctors may need to interpret multiple results in order to provide you with the best plan of care or course of treatment. Therefore, we ask that you please give [place] 2 business days to thoroughly review all your results before contacting the office for clarification. Should we see a critical lab result, you will be contacted sooner.   If You Need Anything After Your Visit  If you have any questions or concerns for your doctor, please call our main line at [phone] and press option 4 to reach your doctor's medical assistant. If no one answers, please leave a voicemail as directed and we will return your call as soon as possible. Messages left after 4 pm will be answered the following business day.   You may also send [place] a message via MyChart. We typically respond to MyChart messages within 1-2 business days.  For prescription refills, please ask your pharmacy to contact our office. Our fax number is [phone].  If you have an urgent issue when the clinic is closed that cannot wait until the next business day, you can page your doctor at the number below.    Please note that while we do our best to be available for urgent issues outside of office hours, we are not available 24/7.   If you have an urgent issue and are unable to reach [place], you may choose to seek medical care at your doctor's  office, retail clinic, urgent care center, or emergency room.  If you have a medical emergency, please immediately call 911 or go to the emergency department.  Pager Numbers  - Dr. Gwen Pounds: [phone]  - Dr. Neale Burly: [phone]  - Dr. Roseanne Reno: [phone]  In the event of inclement weather, please call our main line at [phone] for an update on the status of any delays or closures.  Dermatology Medication Tips: Please keep the boxes that topical medications come in in order to help keep track of the instructions about where and how to use these. Pharmacies typically print the medication instructions only on the boxes and not directly on the medication tubes.   If your medication is too expensive, please contact our office at [phone] option 4 or send [place] a message through MyChart.   We are unable to tell what your co-pay for medications will be in advance as this is different depending on your insurance coverage. However, we may be able to find a substitute medication at lower cost or fill out paperwork to get insurance to cover a needed medication.   If a prior authorization is required to get your medication covered by your insurance company, please allow [place] 1-2 business days to complete this process.  Drug prices often vary depending on where the prescription is filled and some pharmacies may offer cheaper prices.  The website www.goodrx.com contains coupons for medications through different pharmacies. The  prices here do not account for what the cost may be with help from insurance (it may be cheaper with your insurance), but the website can give you the price if you did not use any insurance.  - You can print the associated coupon and take it with your prescription to the pharmacy.  - You may also stop by our office during regular business hours and pick up a GoodRx coupon card.  - If you need your prescription sent electronically to a different pharmacy, notify our office  through [facility] or by phone at [phone] option 4.     Si Usted Necesita Algo Despus de Su Visita  Tambin puede enviarnos un mensaje a travs de Clinical cytogeneticist. Por lo general respondemos a los mensajes de MyChart en el transcurso de 1 a 2 das hbiles.  Para renovar recetas, por favor pida a su farmacia que se ponga en contacto con nuestra oficina. Annie Sable de fax es [place] [phone].  Si tiene un asunto urgente cuando la clnica est cerrada y que no puede esperar hasta el siguiente da hbil, puede llamar/localizar a su doctor(a) al nmero que aparece a continuacin.   Por favor, tenga en cuenta que aunque hacemos todo lo posible para estar disponibles para asuntos urgentes fuera del horario de [place], no estamos disponibles las 24 horas del da, los 7 [address]  [address] de la [place].   Si tiene un problema urgente y no puede comunicarse con nosotros, puede optar por buscar atencin mdica  en el consultorio de su doctor(a), en una clnica privada, en un centro de atencin urgente o en una sala de emergencias.  Si tiene Engineer, drilling, por favor llame inmediatamente al 911 o vaya a la sala de emergencias.  Nmeros de bper  - Dr. Gwen Pounds: [phone]  - Dra. Moye: [phone]  - Dra. Roseanne Reno: [phone]  En caso de inclemencias del [place], por favor llame a Lacy Duverney principal al [phone] para una actualizacin sobre el [place] de cualquier retraso o cierre.  Consejos para la medicacin en dermatologa: Por favor, guarde las cajas en las que vienen los medicamentos de uso tpico para ayudarle a seguir las instrucciones sobre dnde y cmo usarlos. Las farmacias generalmente imprimen las instrucciones del medicamento slo en las cajas y no directamente en los tubos del [place].   Si su medicamento es muy caro, por favor, pngase en contacto con Rolm Gala llamando al [phone] y presione la opcin 4 o envenos un mensaje a travs de Clinical cytogeneticist.   No  podemos decirle cul ser su copago por los medicamentos por adelantado ya que esto es diferente dependiendo de la cobertura de su seguro. Sin embargo, es posible que podamos encontrar un medicamento sustituto a Audiological scientist un formulario para que el seguro cubra el medicamento que se considera necesario.   Si se requiere una autorizacin previa para que su compaa de seguros [place] su medicamento, por favor permtanos de 1 a 2 das hbiles para completar [address]  Los precios de los medicamentos varan con frecuencia dependiendo del Environmental consultant de dnde se surte la receta y alguna farmacias pueden ofrecer precios ms baratos.  El sitio web www.goodrx.com tiene cupones para medicamentos de Health and safety inspector. Los precios aqu no tienen en cuenta lo que podra costar con la ayuda del seguro (puede ser ms barato con su seguro), pero el sitio web puede darle el precio si no utiliz Tourist information centre manager.  - Puede imprimir el cupn correspondiente y llevarlo con su receta a  la farmacia.  - Tambin puede pasar por nuestra oficina durante el horario de atencin regular y Charity fundraiser una tarjeta de cupones de GoodRx.  - Si necesita que su receta se enve electrnicamente a una farmacia diferente, informe a nuestra oficina a travs de MyChart de [place] o por telfono llamando al [phone] y presione la opcin 4.

## 2023-04-01 ENCOUNTER — Encounter: Payer: Self-pay | Admitting: Dermatology

## 2023-05-01 ENCOUNTER — Ambulatory Visit (INDEPENDENT_AMBULATORY_CARE_PROVIDER_SITE_OTHER): Payer: Medicare Other | Admitting: Podiatry

## 2023-05-01 VITALS — BP 118/70

## 2023-05-01 DIAGNOSIS — D696 Thrombocytopenia, unspecified: Secondary | ICD-10-CM | POA: Diagnosis not present

## 2023-05-01 DIAGNOSIS — B351 Tinea unguium: Secondary | ICD-10-CM | POA: Diagnosis not present

## 2023-05-01 NOTE — Progress Notes (Signed)
  Subjective:  Patient ID: Benjamin Deleon, male    DOB: 1941/07/07,  MRN: [id number]  Benjamin Deleon presents to clinic today for: at risk foot care with h/o clotting disorder and painful elongated mycotic toenails 1-5 bilaterally which are tender when wearing enclosed shoe gear. Pain is relieved with periodic professional debridement.  Chief Complaint  Patient presents with   Nail Problem    RFC,Referring Provider Benjamin Rud, MD,LOV:04/24       PCP is Benjamin Rud, MD.  Allergies  Allergen Reactions   Lactose Other (See Comments)    Gas, bloating   Lactose Intolerance (Gi) Diarrhea    Bloating    Wound Dressing Adhesive Itching   Molnupiravir Rash    Review of Systems: Negative except as noted in the HPI.  Objective: No changes noted in today's physical examination. Vitals:   05/01/23 1105  BP: 118/70    Benjamin Deleon is a pleasant 82 y.o. male in NAD. AAO x 3.  Vascular Examination: Capillary refill time <3 seconds b/l LE. Palpable pedal pulses b/l LE. Digital hair present b/l. No pedal edema b/l. Skin temperature gradient WNL b/l. No varicosities b/l. No cyanosis or clubbing noted b/l LE.Marland Kitchen  Dermatological Examination: Pedal skin with normal turgor, texture and tone b/l. No open wounds. No interdigital macerations b/l. Toenails 1-5 b/l thickened, discolored, dystrophic with subungual debris. There is pain on palpation to dorsal aspect of nailplates. .  Neurological Examination: Protective sensation intact with 10 gram monofilament b/l LE. Vibratory sensation intact b/l LE.   Musculoskeletal Examination: Muscle strength 5/5 to all lower extremity muscle groups bilaterally. No pain, crepitus or joint limitation noted with ROM bilateral LE. No gross bony deformities bilaterally.  Assessment/Plan: 1. Onychomycosis of great toe   2. Thrombocytopenia (HCC)    Patient was evaluated and treated. All patient's and/or POA's questions/concerns addressed on today's  visit. Toenails 1-5 debrided in length and girth without incident. Continue soft, supportive shoe gear daily. Report any pedal injuries to medical professional. Call office if there are any questions/concerns. -Patient/POA to call should there be question/concern in the interim.   Return in about 3 months (around 08/01/2023).  Benjamin Deleon, DPM

## 2023-05-09 ENCOUNTER — Encounter: Payer: Self-pay | Admitting: Podiatry

## 2023-05-21 ENCOUNTER — Ambulatory Visit: Payer: Medicare Other | Admitting: Dermatology

## 2023-05-21 VITALS — BP 139/83

## 2023-05-21 DIAGNOSIS — Z7189 Other specified counseling: Secondary | ICD-10-CM

## 2023-05-21 DIAGNOSIS — L853 Xerosis cutis: Secondary | ICD-10-CM

## 2023-05-21 DIAGNOSIS — Z79899 Other long term (current) drug therapy: Secondary | ICD-10-CM

## 2023-05-21 DIAGNOSIS — L219 Seborrheic dermatitis, unspecified: Secondary | ICD-10-CM

## 2023-05-21 DIAGNOSIS — L409 Psoriasis, unspecified: Secondary | ICD-10-CM | POA: Diagnosis not present

## 2023-05-21 DIAGNOSIS — L209 Atopic dermatitis, unspecified: Secondary | ICD-10-CM

## 2023-05-21 DIAGNOSIS — L2089 Other atopic dermatitis: Secondary | ICD-10-CM

## 2023-05-21 MED ORDER — EUCRISA 2 % EX OINT: 1.0000 | TOPICAL_OINTMENT | CUTANEOUS | 6 refills | Status: DC

## 2023-05-21 NOTE — Progress Notes (Signed)
Follow-Up Visit   Subjective  Benjamin Deleon is a 82 y.o. male who presents for the following: Seb derm scalp, 7m f/u, Mometasone prn, otc Nizoral and Coal shampoo, Psoriasis elbows, 72m f/u, Mometasone cr prn, Cerave mostly, Xerosis cerave cream Check spot R post axillary and R hip, using cerave, itching, burning The patient has spots, moles and lesions to be evaluated, some may be new or changing and the patient may have concern these could be cancer.   The following portions of the chart were reviewed this encounter and updated as appropriate: medications, allergies, medical history  Review of Systems:  No other skin or systemic complaints except as noted in HPI or Assessment and Plan.  Objective  Well appearing patient in no apparent distress; mood and affect are within normal limits.   A focused examination was performed of the following areas: Scalp, elbows,   Relevant exam findings are noted in the Assessment and Plan.    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  Chronic and persistent condition with duration or expected duration over one year. Condition is improving with treatment but not currently at goal.    Psoriasis is a chronic non-curable, but treatable genetic/hereditary disease that may have other systemic features affecting other organ systems such as joints (Psoriatic Arthritis). It is associated with an increased risk of inflammatory bowel disease, heart disease, non-alcoholic fatty liver disease, and depression.  Treatments include light and laser treatments; topical medications; and systemic medications including oral and injectables.  Treatment Plan: Cont Mometasone cr qd up to 5d/wk prn flares Cont Cerave cream qd  Topical steroids (such as triamcinolone, fluocinolone,  fluocinonide, mometasone, clobetasol, halobetasol, betamethasone, hydrocortisone) can cause thinning and lightening of the skin if they are used for too long in the same area. Your physician has selected the right strength medicine for your problem and area affected on the body. Please use your medication only as directed by your physician to prevent side effects.     Long term medication management.  Patient is using long term (months to years) prescription medication  to control their dermatologic condition.  These medications require periodic monitoring to evaluate for efficacy and side effects and may require periodic laboratory monitoring.   SEBORRHEIC DERMATITIS scalp Exam: scalp clear today  Chronic and persistent condition with duration or expected duration over one year. Condition is improving with treatment but not currently at goal.   Seborrheic Dermatitis is a chronic persistent rash characterized by pinkness and scaling most commonly of the mid face but also can occur on the scalp (dandruff), ears; mid chest, mid back and groin.  It tends to be exacerbated by stress and cooler weather.  People who have neurologic disease may experience new onset or exacerbation of existing seborrheic dermatitis.  The condition is not curable but treatable and can be controlled.  Treatment Plan: Cont otc Nizoral / Tar shampoo Cont Mometasone cr 3 days a week prn flares   ATOPIC DERMATITIS Exam: Scaly pink papules R post axillary area, R lat waistline 4% BSA  Chronic and persistent condition with duration or expected duration over one year. Condition is bothersome/symptomatic for patient. Currently flared.   Atopic dermatitis (eczema) is a chronic, relapsing, pruritic condition that can significantly affect quality of life. It is often associated with allergic rhinitis and/or asthma  and can require treatment with topical medications, phototherapy, or in severe cases biologic injectable medication  (Dupixent; Adbry) or Oral JAK inhibitors.  Treatment Plan: Start Eucrisa ointment qd/bid aa eczema body  Start Allegra or Claritin qd at noon Cont Cerave cream qd  Recommend gentle skin care.   Return in about 6 months (around 11/21/2023) for AD/Psorisis/seb derm f/u.  I, Ardis Rowan, RMA, am acting as scribe for Armida Sans, MD .   Documentation: I have reviewed the above documentation for accuracy and completeness, and I agree with the above.  Armida Sans, MD

## 2023-05-21 NOTE — Patient Instructions (Addendum)
Start Allegra or Claritin at noon daily Start Eucrisa ointment 1 to 2 times a day as needed for eczema on side and hip   Due to recent changes in healthcare laws, you may see results of your pathology and/or laboratory studies on MyChart before the doctors have had a chance to review them. We understand that in some cases there may be results that are confusing or concerning to you. Please understand that not all results are received at the same time and often the doctors may need to interpret multiple results in order to provide you with the best plan of care or course of treatment. Therefore, we ask that you please give [place] 2 business days to thoroughly review all your results before contacting the office for clarification. Should we see a critical lab result, you will be contacted sooner.   If You Need Anything After Your Visit  If you have any questions or concerns for your doctor, please call our main line at [phone] and press option 4 to reach your doctor's medical assistant. If no one answers, please leave a voicemail as directed and we will return your call as soon as possible. Messages left after 4 pm will be answered the following business day.   You may also send [place] a message via MyChart. We typically respond to MyChart messages within 1-2 business days.  For prescription refills, please ask your pharmacy to contact our office. Our fax number is [phone].  If you have an urgent issue when the clinic is closed that cannot wait until the next business day, you can page your doctor at the number below.    Please note that while we do our best to be available for urgent issues outside of office hours, we are not available 24/7.   If you have an urgent issue and are unable to reach [place], you may choose to seek medical care at your doctor's office, retail clinic, urgent care center, or emergency room.  If you have a medical emergency, please immediately call 911 or go to the emergency  department.  Pager Numbers  - Dr. Gwen Pounds: [phone]  - Dr. Roseanne Reno: [phone]  - Dr. Katrinka Blazing: [phone]   In the event of inclement weather, please call our main line at [phone] for an update on the status of any delays or closures.  Dermatology Medication Tips: Please keep the boxes that topical medications come in in order to help keep track of the instructions about where and how to use these. Pharmacies typically print the medication instructions only on the boxes and not directly on the medication tubes.   If your medication is too expensive, please contact our office at [phone] option 4 or send [place] a message through MyChart.   We are unable to tell what your co-pay for medications will be in advance as this is different depending on your insurance coverage. However, we may be able to find a substitute medication at lower cost or fill out paperwork to get insurance to cover a needed medication.   If a prior authorization is required to get your medication covered by your insurance company, please allow [place] 1-2 business days to complete this process.  Drug prices often vary depending on where the prescription is filled and some pharmacies may offer cheaper prices.  The website www.goodrx.com contains coupons for medications through different pharmacies. The prices here do not account for what the cost may be with help from insurance (it may be cheaper with your insurance),  but the website can give you the price if you did not use any insurance.  - You can print the associated coupon and take it with your prescription to the pharmacy.  - You may also stop by our office during regular business hours and pick up a GoodRx coupon card.  - If you need your prescription sent electronically to a different pharmacy, notify our office through [facility] or by phone at [phone] option 4.     Si Usted Necesita Algo Despus de Su Visita  Tambin puede enviarnos  un mensaje a travs de Clinical cytogeneticist. Por lo general respondemos a los mensajes de MyChart en el transcurso de 1 a 2 das hbiles.  Para renovar recetas, por favor pida a su farmacia que se ponga en contacto con nuestra oficina. Annie Sable de fax es [place] [phone].  Si tiene un asunto urgente cuando la clnica est cerrada y que no puede esperar hasta el siguiente da hbil, puede llamar/localizar a su doctor(a) al nmero que aparece a continuacin.   Por favor, tenga en cuenta que aunque hacemos todo lo posible para estar disponibles para asuntos urgentes fuera del horario de [place], no estamos disponibles las 24 horas del da, los 7 [address]  [address] de la [place].   Si tiene un problema urgente y no puede comunicarse con nosotros, puede optar por buscar atencin mdica  en el consultorio de su doctor(a), en una clnica privada, en un centro de atencin urgente o en una sala de emergencias.  Si tiene Engineer, drilling, por favor llame inmediatamente al 911 o vaya a la sala de emergencias.  Nmeros de bper  - Dr. Gwen Pounds: [phone]  - Dra. Roseanne Reno: [phone]  - Dr. Katrinka Blazing: [phone]   En caso de inclemencias del tiempo, por favor llame a Lacy Duverney principal al [phone] para una actualizacin sobre el [place] de cualquier retraso o cierre.  Consejos para la medicacin en dermatologa: Por favor, guarde las cajas en las que vienen los medicamentos de uso tpico para ayudarle a seguir las instrucciones sobre dnde y cmo usarlos. Las farmacias generalmente imprimen las instrucciones del medicamento slo en las cajas y no directamente en los tubos del [place].   Si su medicamento es muy caro, por favor, pngase en contacto con Rolm Gala llamando al [phone] y presione la opcin 4 o envenos un mensaje a travs de Clinical cytogeneticist.   No podemos decirle cul ser su copago por los medicamentos por adelantado ya que esto es diferente dependiendo de la cobertura de su seguro. Sin  embargo, es posible que podamos encontrar un medicamento sustituto a Audiological scientist un formulario para que el seguro cubra el medicamento que se considera necesario.   Si se requiere una autorizacin previa para que su compaa de seguros [place] su medicamento, por favor permtanos de 1 a 2 das hbiles para completar [address]  Los precios de los medicamentos varan con frecuencia dependiendo del Environmental consultant de dnde se surte la receta y alguna farmacias pueden ofrecer precios ms baratos.  El sitio web www.goodrx.com tiene cupones para medicamentos de Health and safety inspector. Los precios aqu no tienen en cuenta lo que podra costar con la ayuda del seguro (puede ser ms barato con su seguro), pero el sitio web puede darle el precio si no utiliz Tourist information centre manager.  - Puede imprimir el cupn correspondiente y llevarlo con su receta a la farmacia.  - Tambin puede pasar por nuestra oficina durante el horario de atencin regular y Education officer, museum una tarjeta de  cupones de GoodRx.  - Si necesita que su receta se enve electrnicamente a una farmacia diferente, informe a nuestra oficina a travs de MyChart de [place] o por telfono llamando al [phone] y presione la opcin 4.

## 2023-05-25 ENCOUNTER — Telehealth: Payer: Self-pay

## 2023-05-25 NOTE — Telephone Encounter (Signed)
ERROR

## 2023-05-31 ENCOUNTER — Encounter: Payer: Self-pay | Admitting: Dermatology

## 2023-06-01 ENCOUNTER — Telehealth: Payer: Self-pay

## 2023-06-01 NOTE — Telephone Encounter (Signed)
Patient left nurse voicemail that Pam Drown has a $300 copay. He did research that his insurance will cover Tacrolimus with a $10 copay. Okay to switch?

## 2023-06-02 MED ORDER — TACROLIMUS 0.1 % EX OINT: TOPICAL_OINTMENT | CUTANEOUS | 5 refills | Status: AC

## 2023-06-02 NOTE — Telephone Encounter (Signed)
RX sent in and patient advised. 

## 2023-07-02 ENCOUNTER — Ambulatory Visit: Payer: Medicare Other | Admitting: Dermatology

## 2023-07-08 ENCOUNTER — Encounter: Payer: Self-pay | Admitting: Dermatology

## 2023-07-08 ENCOUNTER — Ambulatory Visit: Payer: Medicare Other | Admitting: Dermatology

## 2023-07-08 DIAGNOSIS — L219 Seborrheic dermatitis, unspecified: Secondary | ICD-10-CM

## 2023-07-08 DIAGNOSIS — Z1283 Encounter for screening for malignant neoplasm of skin: Secondary | ICD-10-CM

## 2023-07-08 DIAGNOSIS — L821 Other seborrheic keratosis: Secondary | ICD-10-CM

## 2023-07-08 DIAGNOSIS — L814 Other melanin hyperpigmentation: Secondary | ICD-10-CM

## 2023-07-08 DIAGNOSIS — B360 Pityriasis versicolor: Secondary | ICD-10-CM

## 2023-07-08 DIAGNOSIS — L2389 Allergic contact dermatitis due to other agents: Secondary | ICD-10-CM

## 2023-07-08 DIAGNOSIS — D1801 Hemangioma of skin and subcutaneous tissue: Secondary | ICD-10-CM

## 2023-07-08 DIAGNOSIS — L209 Atopic dermatitis, unspecified: Secondary | ICD-10-CM

## 2023-07-08 DIAGNOSIS — D229 Melanocytic nevi, unspecified: Secondary | ICD-10-CM

## 2023-07-08 DIAGNOSIS — L578 Other skin changes due to chronic exposure to nonionizing radiation: Secondary | ICD-10-CM

## 2023-07-08 DIAGNOSIS — Z79899 Other long term (current) drug therapy: Secondary | ICD-10-CM

## 2023-07-08 DIAGNOSIS — L409 Psoriasis, unspecified: Secondary | ICD-10-CM

## 2023-07-08 DIAGNOSIS — L2089 Other atopic dermatitis: Secondary | ICD-10-CM

## 2023-07-08 DIAGNOSIS — W908XXA Exposure to other nonionizing radiation, initial encounter: Secondary | ICD-10-CM

## 2023-07-08 DIAGNOSIS — Z7189 Other specified counseling: Secondary | ICD-10-CM

## 2023-07-08 DIAGNOSIS — Z86018 Personal history of other benign neoplasm: Secondary | ICD-10-CM

## 2023-07-08 MED ORDER — KETOCONAZOLE 2 % EX SHAM: MEDICATED_SHAMPOO | CUTANEOUS | 5 refills | Status: DC

## 2023-07-08 NOTE — Progress Notes (Signed)
Follow-Up Visit   Subjective  Benjamin Deleon is a 82 y.o. male who presents for the following: Skin Cancer Screening and Full Body Skin Exam  The patient presents for Total-Body Skin Exam (TBSE) for skin cancer screening and mole check. The patient has spots, moles and lesions to be evaluated, some may be new or changing and the patient may have concern these could be cancer.  The following portions of the chart were reviewed this encounter and updated as appropriate: medications, allergies, medical history  Review of Systems:  No other skin or systemic complaints except as noted in HPI or Assessment and Plan.  Objective  Well appearing patient in no apparent distress; mood and affect are within normal limits.  A full examination was performed including scalp, head, eyes, ears, nose, lips, neck, chest, axillae, abdomen, back, buttocks, bilateral upper extremities, bilateral lower extremities, hands, feet, fingers, toes, fingernails, and toenails. All findings within normal limits unless otherwise noted below.   Relevant physical exam findings are noted in the Assessment and Plan.    Assessment & Plan   SKIN CANCER SCREENING PERFORMED TODAY.  ACTINIC DAMAGE - Chronic condition, secondary to cumulative UV/sun exposure - diffuse scaly erythematous macules with underlying dyspigmentation - Recommend daily broad spectrum sunscreen SPF 30+ to sun-exposed areas, reapply every 2 hours as needed.  - Staying in the shade or wearing long sleeves, sun glasses (UVA+UVB protection) and wide brim hats (4-inch brim around the entire circumference of the hat) are also recommended for sun protection.  - Call for new or changing lesions.  LENTIGINES, SEBORRHEIC KERATOSES, HEMANGIOMAS - Benign normal skin lesions - Benign-appearing - Call for any changes  MELANOCYTIC NEVI - Tan-brown and/or pink-flesh-colored symmetric macules and papules - Benign appearing on exam today - Observation - Call  clinic for new or changing moles - Recommend daily use of broad spectrum spf 30+ sunscreen to sun-exposed areas.   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  Call if any new or changing lesions are noted between office visits  Tinea Versicolor Chronic and persistent condition with duration or expected duration over one year. Condition is bothersome/symptomatic for patient. Currently flared.  Use ketoconazole shampoo as body wash twice a week for a couple of months. Once controlled may use Q4W to prevent recurrence.    Tinea versicolor is a chronic recurrent skin rash causing discolored scaly spots most commonly seen on back, chest, and/or shoulders.  It is generally asymptomatic. The rash is due to overgrowth of a common type of yeast present on everyone's skin and it is not contagious.  It tends to flare more in the summer due to increased sweating on trunk.  After rash is treated, the scaliness will resolve, but the discoloration will take longer to return to normal pigmentation. The periodic use of an OTC medicated soap/shampoo with zinc or selenium sulfide can be helpful to prevent yeast overgrowth and recurrence.    SEBORRHEIC DERMATITIS scalp Exam: scalp clear today Chronic and persistent condition with duration or expected duration over one year. Condition is improving with treatment but not currently at goal.    Seborrheic Dermatitis is a chronic persistent rash characterized by pinkness and scaling most commonly of the mid face but also can occur on the scalp (dandruff), ears; mid chest, mid back and groin.  It tends to be exacerbated by stress  and cooler weather.  People who have neurologic disease may experience new onset or exacerbation of existing seborrheic dermatitis.  The condition is not curable but treatable and can be  controlled.   Treatment Plan: Cont otc Nizoral / Tar shampoo Cont Mometasone cr 3 days a week prn flares  ATOPIC DERMATITIS Exam: Scaly pink papules R post axillary area, R lat waistline 4% BSA Chronic and persistent condition with duration or expected duration over one year. Condition is bothersome/symptomatic for patient. Currently flared.  Atopic dermatitis (eczema) is a chronic, relapsing, pruritic condition that can significantly affect quality of life. It is often associated with allergic rhinitis and/or asthma and can require treatment with topical medications, phototherapy, or in severe cases biologic injectable medication (Dupixent; Adbry) or Oral JAK inhibitors.   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 Chronic and persistent condition with duration or expected duration over one year. Condition is improving with treatment but not currently at goal.  Psoriasis is a chronic non-curable, but treatable genetic/hereditary disease that may have other systemic features affecting other organ systems such as joints (Psoriatic Arthritis). It is associated with an increased risk of inflammatory bowel disease, heart disease, non-alcoholic fatty liver disease, and depression.  Treatments include light and laser treatments; topical medications; and systemic medications including oral and injectables.   Treatment Plan: Cont Mometasone cr qd up to 5d/wk prn flares Cont Cerave cream qd   Topical steroids (such as triamcinolone, fluocinolone, fluocinonide, mometasone, clobetasol, halobetasol, betamethasone, hydrocortisone) can cause thinning and lightening of the skin if they are used for too long in the same area. Your physician has selected the right strength medicine for your problem and area affected on the body. Please use your medication only as  directed by your physician to prevent side effects.      Long term medication management.  Patient is using long term (months to years) prescription medication  to control their dermatologic condition.  These medications require periodic monitoring to evaluate for efficacy and side effects and may require periodic laboratory monitoring.   Contact dermatitis to elastic in waistband -  Exam: Scaly pink papules coalescing to plaques  Treatment Plan: Start Mometasone cream QD-BID PRN or OTC HC cream.    Return in about 1 year (around 07/07/2024) for TBSE.  Maylene Roes, CMA, am acting as scribe for Armida Sans, MD .   Documentation: I have reviewed the above documentation for accuracy and completeness, and I agree with the above.  Armida Sans, MD

## 2023-07-08 NOTE — Patient Instructions (Signed)

## 2023-08-03 ENCOUNTER — Ambulatory Visit (INDEPENDENT_AMBULATORY_CARE_PROVIDER_SITE_OTHER): Payer: Medicare Other | Admitting: Podiatry

## 2023-08-03 ENCOUNTER — Encounter: Payer: Self-pay | Admitting: Podiatry

## 2023-08-03 VITALS — Ht 72.0 in | Wt 180.0 lb

## 2023-08-03 DIAGNOSIS — M79675 Pain in left toe(s): Secondary | ICD-10-CM

## 2023-08-03 DIAGNOSIS — M79674 Pain in right toe(s): Secondary | ICD-10-CM

## 2023-08-03 DIAGNOSIS — D696 Thrombocytopenia, unspecified: Secondary | ICD-10-CM

## 2023-08-03 DIAGNOSIS — B351 Tinea unguium: Secondary | ICD-10-CM

## 2023-08-03 NOTE — Progress Notes (Signed)
  Subjective:  Patient ID: Benjamin Deleon, male    DOB: 10-02-1941,  MRN: [id number]  82 y.o. male presents painful elongated mycotic toenails 1-5 bilaterally which are tender when wearing enclosed shoe gear. Pain is relieved with periodic professional debridement. Chief Complaint  Patient presents with   Nail Problem    RFC. Pt is not a diabetic, last office visit was 3 weeks ago, PCP is Dr Larwance Sachs.   New problem(s): None   PCP is Kandyce Rud, MD.  Allergies  Allergen Reactions   Lactose Other (See Comments)    Gas, bloating   Lactose Intolerance (Gi) Diarrhea    Bloating    Wound Dressing Adhesive Itching   Molnupiravir Rash    Review of Systems: Negative except as noted in the HPI.   Objective:  Benjamin Deleon is a pleasant 82 y.o. male WD, WN in NAD. AAO x 3.  Vascular Examination: Vascular status intact b/l with palpable pedal pulses. CFT immediate b/l. Pedal hair present. No edema. No pain with calf compression b/l. Skin temperature gradient WNL b/l. No varicosities noted. No cyanosis or clubbing noted.  Neurological Examination: Sensation grossly intact b/l with 10 gram monofilament. Vibratory sensation intact b/l.  Dermatological Examination: Pedal skin with normal turgor, texture and tone b/l. No open wounds nor interdigital macerations noted. Toenails 1-5 b/l thick, discolored, elongated with subungual debris and pain on dorsal palpation. No hyperkeratotic lesions noted b/l.   Musculoskeletal Examination: Muscle strength 5/5 to b/l LE.  No pain, crepitus noted b/l. No gross pedal deformities. Patient ambulates independently without assistive aids.   Radiographs: None Last A1c:       No data to display          Assessment:   1. Pain due to onychomycosis of toenails of both feet   2. Thrombocytopenia (HCC)    Plan:  -Consent given for treatment as described below: -Examined patient. -Patient to continue soft, supportive shoe gear daily. -Toenails 1-5  b/l were debrided in length and girth with sterile nail nippers and dremel without iatrogenic bleeding.  -Patient/POA to call should there be question/concern in the interim.  Return in about 9 weeks (around 10/05/2023).  Freddie Breech, DPM

## 2023-10-09 ENCOUNTER — Encounter: Payer: Self-pay | Admitting: Podiatry

## 2023-10-09 ENCOUNTER — Ambulatory Visit (INDEPENDENT_AMBULATORY_CARE_PROVIDER_SITE_OTHER): Payer: Medicare Other | Admitting: Podiatry

## 2023-10-09 VITALS — Ht 72.0 in | Wt 180.0 lb

## 2023-10-09 DIAGNOSIS — M79675 Pain in left toe(s): Secondary | ICD-10-CM | POA: Diagnosis not present

## 2023-10-09 DIAGNOSIS — B351 Tinea unguium: Secondary | ICD-10-CM | POA: Diagnosis not present

## 2023-10-09 DIAGNOSIS — M79674 Pain in right toe(s): Secondary | ICD-10-CM | POA: Diagnosis not present

## 2023-10-15 NOTE — Progress Notes (Signed)
  Subjective:  Patient ID: Benjamin Deleon, male    DOB: [birth year],  MRN: [id number]  83 y.o. male presents to clinic with  painful thick toenails that are difficult to trim. Pain interferes with ambulation. Aggravating factors include wearing enclosed shoe gear. Pain is relieved with periodic professional debridement.  Chief Complaint  Patient presents with   Nail Problem    Patient is here for RFC   New problem(s): None   PCP is Diedra Lame, MD.  Allergies  Allergen Reactions   Lactose Other (See Comments)    Gas, bloating   Lactose Intolerance (Gi) Diarrhea    Bloating    Wound Dressing Adhesive Itching   Molnupiravir Rash    Review of Systems: Negative except as noted in the HPI.   Objective:  Benjamin Deleon is a pleasant 83 y.o. male WD, WN in NAD. AAO x 3.  Vascular Examination: Vascular status intact b/l with palpable pedal pulses. CFT immediate b/l. No edema. No pain with calf compression b/l. Skin temperature gradient WNL b/l. No cyanosis or clubbing noted b/l LE.  Neurological Examination: Sensation grossly intact b/l with 10 gram monofilament. Vibratory sensation intact b/l.   Dermatological Examination: Pedal skin with normal turgor, texture and tone b/l. Toenails 1-5 b/l thick, discolored, elongated with subungual debris and pain on dorsal palpation. No corns, calluses nor porokeratotic lesions noted.  Musculoskeletal Examination: Muscle strength 5/5 to b/l LE. No gross bony deformities bilaterally.  Radiographs: None  Last A1c:       No data to display           Assessment:   1. Pain due to onychomycosis of toenails of both feet    Plan:  -Patient was evaluated today. All questions/concerns addressed on today's visit. -Continue supportive shoe gear daily. -Mycotic toenails 1-5 bilaterally were debrided in length and girth with sterile nail nippers and dremel without incident. -Patient/POA to call should there be question/concern in the  interim.  Return in about 9 weeks (around 12/11/2023).  Benjamin Deleon, DPM       LOCATION: [address] [place] [address]                   Office [phone]   [facility] LOCATION: [address] [place], [place] [address] Office [phone]

## 2023-11-25 ENCOUNTER — Ambulatory Visit: Payer: Medicare Other | Admitting: Dermatology

## 2023-12-14 ENCOUNTER — Encounter: Payer: Self-pay | Admitting: Podiatry

## 2023-12-14 ENCOUNTER — Ambulatory Visit (INDEPENDENT_AMBULATORY_CARE_PROVIDER_SITE_OTHER): Payer: Medicare Other | Admitting: Podiatry

## 2023-12-14 DIAGNOSIS — M79675 Pain in left toe(s): Secondary | ICD-10-CM

## 2023-12-14 DIAGNOSIS — M79674 Pain in right toe(s): Secondary | ICD-10-CM | POA: Diagnosis not present

## 2023-12-14 DIAGNOSIS — B351 Tinea unguium: Secondary | ICD-10-CM | POA: Diagnosis not present

## 2023-12-14 DIAGNOSIS — D696 Thrombocytopenia, unspecified: Secondary | ICD-10-CM | POA: Diagnosis not present

## 2023-12-14 NOTE — Progress Notes (Signed)
  Subjective:  Patient ID: Benjamin Deleon, male    DOB: July 13, 1941,  MRN: [id number]  83 y.o. male presents to clinic with  at risk foot care with h/o clotting disorder and painful, elongated thickened toenails x 10 which are symptomatic when wearing enclosed shoe gear. This interferes with his/her daily activities. Patient states he has a new toenail growing out on the right great toe.   New problem(s): None   PCP is Kandyce Rud, MD.  Allergies  Allergen Reactions   Lactose Other (See Comments)    Gas, bloating   Lactose Intolerance (Gi) Diarrhea    Bloating    Wound Dressing Adhesive Itching   Molnupiravir Rash    Review of Systems: Negative except as noted in the HPI.   Objective:  Benjamin Deleon is a pleasant 83 y.o. male WD, WN in NAD. AAO x 3.  Vascular Examination: Vascular status intact b/l with palpable pedal pulses. CFT immediate b/l. No edema. No pain with calf compression b/l. Skin temperature gradient WNL b/l. No ischemia or gangrene noted b/l LE. No cyanosis or clubbing noted b/l LE.  Neurological Examination: Sensation grossly intact b/l with 10 gram monofilament. Vibratory sensation intact b/l.   Dermatological Examination: Pedal skin with normal turgor, texture and tone b/l. Toenails 1-5 b/l thick, discolored, elongated with subungual debris and pain on dorsal palpation. No hyperkeratotic lesions noted b/l. There is evidence of subacute subungual hematoma of the distal 50% of the right great toenail. Nailplate remains adhered. There is no  tenderness to palpation.   Musculoskeletal Examination: Muscle strength 5/5 to b/l LE. No pain, crepitus or joint limitation noted with ROM bilateral LE. No gross bony deformities bilaterally.  Radiographs: None  Last A1c:       No data to display         Assessment:   1. Pain due to onychomycosis of toenails of both feet   2. Thrombocytopenia (HCC)    Plan:  Patient was evaluated and treated. All patient's  and/or POA's questions/concerns addressed on today's visit. Toenails 1-5 debrided in length and girth without incident. Continue soft, supportive shoe gear daily. Report any pedal injuries to medical professional. Call office if there are any questions/concerns. -Patient/POA to call should there be question/concern in the interim.  Return in about 3 months (around 03/15/2024).  Freddie Breech, DPM      [facility] LOCATION: [address] [place] [address]                   Office [phone]   [facility] LOCATION: [address] [place], [place] [address] Office [phone]

## 2023-12-29 ENCOUNTER — Encounter: Payer: Self-pay | Admitting: Dermatology

## 2023-12-29 ENCOUNTER — Ambulatory Visit (INDEPENDENT_AMBULATORY_CARE_PROVIDER_SITE_OTHER): Payer: Medicare Other | Admitting: Dermatology

## 2023-12-29 DIAGNOSIS — L821 Other seborrheic keratosis: Secondary | ICD-10-CM | POA: Diagnosis not present

## 2023-12-29 DIAGNOSIS — L308 Other specified dermatitis: Secondary | ICD-10-CM | POA: Diagnosis not present

## 2023-12-29 DIAGNOSIS — R21 Rash and other nonspecific skin eruption: Secondary | ICD-10-CM | POA: Diagnosis not present

## 2023-12-29 NOTE — Progress Notes (Signed)
   Follow-Up Visit   Subjective  Benjamin Deleon is a 83 y.o. male who presents for the following: seb derm, patient using Nizoral cold tar shampoo, ketoconazole shampoo, mometasone lotion occasionally. Patient using mometasone cr, tacrolimus but mostly CeraVe cream at areas of psoriasis/AD, areas do itch.  Patient does have a rough spot at forehead.   The patient has spots, moles and lesions to be evaluated, some may be new or changing and the patient may have concern these could be cancer.   The following portions of the chart were reviewed this encounter and updated as appropriate: medications, allergies, medical history  Review of Systems:  No other skin or systemic complaints except as noted in HPI or Assessment and Plan.  Objective  Well appearing patient in no apparent distress; mood and affect are within normal limits.   A focused examination was performed of the following areas: Legs, arms, trunk, scalp  Relevant exam findings are noted in the Assessment and Plan.  Left Thigh - Posterior Pink to hyperpigmented arcuate patches on posterior thighs, right knee, b/l flanks   Assessment & Plan   SEBORRHEIC KERATOSIS - Stuck-on, waxy, tan-brown papules and/or plaques  - Benign-appearing - Discussed benign etiology and prognosis. - Observe - Call for any changes  RASH Left Thigh - Posterior Continue mometasone or tacrolimus every day prn. Avoid applying to face, groin, and axilla. Use as directed. Long-term use can cause thinning of the skin. Continue CeraVe  Topical steroids (such as triamcinolone, fluocinolone, fluocinonide, mometasone, clobetasol, halobetasol, betamethasone, hydrocortisone) can cause thinning and lightening of the skin if they are used for too long in the same area. Your physician has selected the right strength medicine for your problem and area affected on the body. Please use your medication only as directed by your physician to prevent side effects.    Skin / nail biopsy - Left Thigh - Posterior Type of biopsy: tangential   Informed consent: discussed and consent obtained   Timeout: patient name, date of birth, surgical site, and procedure verified   Procedure prep:  Patient was prepped and draped in usual sterile fashion Prep type:  Isopropyl alcohol Anesthesia: the lesion was anesthetized in a standard fashion   Anesthetic:  1% lidocaine w/ epinephrine 1-100,000 buffered w/ 8.4% NaHCO3 Instrument used: DermaBlade   Hemostasis achieved with: pressure and aluminum chloride   Outcome: patient tolerated procedure well   Post-procedure details: sterile dressing applied and wound care instructions given   Dressing type: bandage and petrolatum   Specimen 1 - Surgical pathology Differential Diagnosis: eczema vs mycosis fungoides vs granuloma annulare vs psoriasis vs parapsoriasis  Check Margins: No  Return for TBSE, with Dr. Kirtland Bouchard, as scheduled, HxDN.  Anise Salvo, RMA, am acting as scribe for Elie Goody, MD .   Documentation: I have reviewed the above documentation for accuracy and completeness, and I agree with the above.  Elie Goody, MD

## 2023-12-29 NOTE — Patient Instructions (Signed)

## 2024-01-04 ENCOUNTER — Telehealth: Payer: Self-pay

## 2024-01-04 NOTE — Telephone Encounter (Signed)
-----   Message from [place] sent at 12/31/2023  5:29 PM EDT ----- left thigh - posterior :       LYMPHOCYTIC SPONGIOTIC DERMATITIS, SEE DESCRIPTION   Please call to share that the rash biopsy showed some evidence that the rash is not typical eczema, but the pathologist could not fully tell. It is common for mycosis fungoides to be subtle on biopsies. Recommend another biopsy in a different location. Please schedule the patient for another biopsy at their earliest convenience. Ok to [employer]

## 2024-01-07 ENCOUNTER — Telehealth: Payer: Self-pay

## 2024-01-07 NOTE — Telephone Encounter (Signed)
 Reviewed pathology results with patient, scheduled for repeat biopsy 4/14 at 4:45 pm. Butch Penny., RMA

## 2024-01-07 NOTE — Telephone Encounter (Signed)
-----   Message from [place] sent at 12/31/2023  5:29 PM EDT ----- left thigh - posterior :       LYMPHOCYTIC SPONGIOTIC DERMATITIS, SEE DESCRIPTION   Please call to share that the rash biopsy showed some evidence that the rash is not typical eczema, but the pathologist could not fully tell. It is common for mycosis fungoides to be subtle on biopsies. Recommend another biopsy in a different location. Please schedule the patient for another biopsy at their earliest convenience. Ok to [employer]

## 2024-01-18 ENCOUNTER — Encounter: Payer: Self-pay | Admitting: Dermatology

## 2024-01-18 ENCOUNTER — Ambulatory Visit (INDEPENDENT_AMBULATORY_CARE_PROVIDER_SITE_OTHER): Admitting: Dermatology

## 2024-01-18 DIAGNOSIS — R21 Rash and other nonspecific skin eruption: Secondary | ICD-10-CM

## 2024-01-18 DIAGNOSIS — L280 Lichen simplex chronicus: Secondary | ICD-10-CM | POA: Diagnosis not present

## 2024-01-18 NOTE — Progress Notes (Unsigned)
   Follow-Up Visit   Subjective  Benjamin Deleon is a 83 y.o. male who presents for the following: repeat biopsy of rash.    The following portions of the chart were reviewed this encounter and updated as appropriate: medications, allergies, medical history  Review of Systems:  No other skin or systemic complaints except as noted in HPI or Assessment and Plan.  Objective  Well appearing patient in no apparent distress; mood and affect are within normal limits.  A focused examination was performed of the following areas: Torso, arms, legs  Relevant exam findings are noted in the Assessment and Plan.  right flank Pink to hyperpigmented arcuate patches on posterior thighs, right knee, b/l flanks   Assessment & Plan     RASH right flank Skin / nail biopsy - right flank Type of biopsy: tangential   Informed consent: discussed and consent obtained   Timeout: patient name, date of birth, surgical site, and procedure verified   Procedure prep:  Patient was prepped and draped in usual sterile fashion Prep type:  Isopropyl alcohol Anesthesia: the lesion was anesthetized in a standard fashion   Anesthetic:  1% lidocaine w/ epinephrine 1-100,000 buffered w/ 8.4% NaHCO3 Instrument used: DermaBlade   Hemostasis achieved with: pressure and aluminum chloride   Outcome: patient tolerated procedure well   Post-procedure details: sterile dressing applied and wound care instructions given   Dressing type: bandage and petrolatum   Specimen 1 - Surgical pathology Differential Diagnosis: R/O mycosis fungoides   Check Margins: No  Return for TBSE As Scheduled.  I, Jill Parcell, CMA, am acting as scribe for Harris Liming, MD.   Documentation: I have reviewed the above documentation for accuracy and completeness, and I agree with the above.  Harris Liming, MD

## 2024-01-18 NOTE — Patient Instructions (Signed)

## 2024-01-26 ENCOUNTER — Encounter: Payer: Self-pay | Admitting: Dermatology

## 2024-01-26 LAB — SURGICAL PATHOLOGY

## 2024-01-28 ENCOUNTER — Telehealth: Payer: Self-pay

## 2024-01-28 NOTE — Telephone Encounter (Signed)
 Spoke with Trenia Fritter at [facility]. They will send out biopsies done on 12/29/23 at left thigh and on 01/18/24 at right flank for TCR rearrangement tests. Could take up to 10 days to get results back.  Sue Em., RMA

## 2024-02-09 DIAGNOSIS — C8400 Mycosis fungoides, unspecified site: Secondary | ICD-10-CM | POA: Insufficient documentation

## 2024-02-09 LAB — SURGICAL PATHOLOGY

## 2024-02-11 ENCOUNTER — Telehealth: Payer: Self-pay | Admitting: Dermatology

## 2024-02-11 NOTE — Telephone Encounter (Signed)
 Called to share TCR rearrangement result but got answering machine

## 2024-02-15 ENCOUNTER — Telehealth: Payer: Self-pay | Admitting: Dermatology

## 2024-02-15 NOTE — Telephone Encounter (Signed)
 Called home and mobile numbers to share result but got answering machine

## 2024-02-16 ENCOUNTER — Encounter: Payer: Self-pay | Admitting: Dermatology

## 2024-02-16 ENCOUNTER — Other Ambulatory Visit: Payer: Self-pay | Admitting: Dermatology

## 2024-02-16 ENCOUNTER — Ambulatory Visit: Payer: Self-pay | Admitting: Dermatology

## 2024-02-16 DIAGNOSIS — C8400 Mycosis fungoides, unspecified site: Secondary | ICD-10-CM

## 2024-02-16 NOTE — Telephone Encounter (Signed)
 Called but no answer.

## 2024-02-18 ENCOUNTER — Ambulatory Visit: Payer: Self-pay | Admitting: Urology

## 2024-02-23 ENCOUNTER — Ambulatory Visit: Payer: Self-pay | Admitting: Dermatology

## 2024-02-23 LAB — CBC WITH DIFFERENTIAL/PLATELET
Basophils Absolute: 0 10*3/uL (ref 0.0–0.2)
Basos: 1 %
EOS (ABSOLUTE): 0.3 10*3/uL (ref 0.0–0.4)
Eos: 4 %
Hematocrit: 40.7 % (ref 37.5–51.0)
Hemoglobin: 14.1 g/dL (ref 13.0–17.7)
Immature Grans (Abs): 0 10*3/uL (ref 0.0–0.1)
Immature Granulocytes: 0 %
Lymphocytes Absolute: 1.7 10*3/uL (ref 0.7–3.1)
Lymphs: 25 %
MCH: 31.9 pg (ref 26.6–33.0)
MCHC: 34.6 g/dL (ref 31.5–35.7)
MCV: 92 fL (ref 79–97)
Monocytes Absolute: 0.5 10*3/uL (ref 0.1–0.9)
Monocytes: 7 %
Neutrophils Absolute: 4.4 10*3/uL (ref 1.4–7.0)
Neutrophils: 63 %
Platelets: 124 10*3/uL — ABNORMAL LOW (ref 150–450)
RBC: 4.42 x10E6/uL (ref 4.14–5.80)
RDW: 13.1 % (ref 11.6–15.4)
WBC: 6.9 10*3/uL (ref 3.4–10.8)

## 2024-02-23 LAB — COMPREHENSIVE METABOLIC PANEL WITH GFR
ALT: 12 IU/L (ref 0–44)
AST: 17 IU/L (ref 0–40)
Albumin: 4.1 g/dL (ref 3.8–4.8)
Alkaline Phosphatase: 52 IU/L (ref 44–121)
BUN/Creatinine Ratio: 15 (ref 10–24)
BUN: 19 mg/dL (ref 8–27)
Bilirubin Total: 0.7 mg/dL (ref 0.0–1.2)
CO2: 24 mmol/L (ref 20–29)
Calcium: 9.2 mg/dL (ref 8.6–10.2)
Chloride: 100 mmol/L (ref 96–106)
Creatinine, Ser: 1.29 mg/dL — ABNORMAL HIGH (ref 0.76–1.27)
Globulin, Total: 2.4 g/dL (ref 1.5–4.5)
Glucose: 109 mg/dL — ABNORMAL HIGH (ref 70–99)
Potassium: 4.2 mmol/L (ref 3.5–5.2)
Sodium: 137 mmol/L (ref 134–144)
Total Protein: 6.5 g/dL (ref 6.0–8.5)
eGFR: 56 mL/min/{1.73_m2} — ABNORMAL LOW (ref >59)

## 2024-02-23 LAB — LACTATE DEHYDROGENASE: LDH: 168 IU/L (ref 121–224)

## 2024-02-25 ENCOUNTER — Ambulatory Visit: Admitting: Dermatology

## 2024-02-25 ENCOUNTER — Emergency Department
Admission: EM | Admit: 2024-02-25 | Discharge: 2024-02-25 | Disposition: A | Discharge status: Home / Self Care | Attending: Emergency Medicine | Admitting: Emergency Medicine

## 2024-02-25 ENCOUNTER — Emergency Department

## 2024-02-25 ENCOUNTER — Other Ambulatory Visit: Payer: Self-pay

## 2024-02-25 DIAGNOSIS — W06XXXA Fall from bed, initial encounter: Secondary | ICD-10-CM | POA: Diagnosis not present

## 2024-02-25 DIAGNOSIS — S2242XA Multiple fractures of ribs, left side, initial encounter for closed fracture: Secondary | ICD-10-CM | POA: Diagnosis not present

## 2024-02-25 DIAGNOSIS — S29001A Unspecified injury of muscle and tendon of front wall of thorax, initial encounter: Secondary | ICD-10-CM | POA: Diagnosis present

## 2024-02-25 MED ORDER — IBUPROFEN 600 MG PO TABS
600.0000 mg | ORAL_TABLET | Freq: Once | ORAL | Status: AC
  Administered 2024-02-25: 600 mg via ORAL
  Filled 2024-02-25: qty 1

## 2024-02-25 NOTE — ED Provider Notes (Signed)
 [facility] Provider Note    Event Date/Time   First MD Initiated Contact with Patient 02/25/24 0710     (approximate)   History   Fall   HPI  Benjamin Deleon is a 83 y.o. male history of mild thrombocytopenia, polymyalgia rheumatica    Patient fell out of bed this morning.  Reports that he sometimes tosses about in the bed, occasionally suffers from night terrors.  He believes that he fell out of bed, landed on his left side.  He denies injuring his head.  No neck pain.  No stomach pain no lower back pain.  Patient reports that he is having pain in the ribs of his mid left back.  Also little bit along the mid ribs around the axilla  No difficulty breathing except when he takes deep breath it does hurt.  Movement makes the area painful.  Did not lose consciousness.  No other injuries.  Physical Exam   Triage Vital Signs: ED Triage Vitals  Encounter Vitals Group     BP 02/25/24 0652 (!) 143/87     Systolic BP Percentile --      Diastolic BP Percentile --      Pulse Rate 02/25/24 0652 68     Resp 02/25/24 0652 18     Temp 02/25/24 0652 98.1 F (36.7 C)     Temp src --      SpO2 02/25/24 0652 100 %     Weight 02/25/24 0651 180 lb (81.6 kg)     Height 02/25/24 0651 5\' 6"  (1.676 m)     Head Circumference --      Peak Flow --      Pain Score 02/25/24 0650 3     Pain Loc --      Pain Education --      Exclude from Growth Chart --     Most recent vital signs: Vitals:   02/25/24 0652  BP: (!) 143/87  Pulse: 68  Resp: 18  Temp: 98.1 F (36.7 C)  SpO2: 100%     General: Awake, no distress.  Normocephalic atraumatic CV:  Good peripheral perfusion.  Normal tones Resp:  Normal effort.  Clear lungs bilateral but splints when taking deep breath. Abd:  No distention.  Nondistended no bruising, nontender.  Patient does have a slight red rash at the lower left sternal border, he reports has been present for quite some time seeing dermatology for.   Not new Other:  Moves all extremities well is very pleasant stands and ambulates to take his shirt off without difficulty.     ED Results / Procedures / Treatments   Labs (all labs ordered are listed, but only abnormal results are displayed) Labs Reviewed - No data to display   EKG     RADIOLOGY   Chest x-ray interpreted as acute displaced rib fracture, no pneumothorax  DG Ribs Unilateral W/Chest Left Result Date: 02/25/2024 CLINICAL DATA:  Status post fall. EXAM: LEFT RIBS AND CHEST - 3+ VIEW COMPARISON:  None Available. FINDINGS: Acute displaced fracture involving the posterior aspect of the left fourth rib is identified. Equivocal nondisplaced fracture of the posterior left 6th rib. Remote anterior left sixth rib fracture. Heart size is normal. No pleural fluid. No pneumothorax. Minimal bibasilar atelectasis. IMPRESSION: 1. Acute displaced fracture involving the posterior aspect of the left fourth rib. 2. Equivocal nondisplaced fracture of the posterior left 6th rib. 3. Remote anterior left sixth rib fracture. Electronically Signed   By:  Kimberley Penman M.D.   On: 02/25/2024 07:27      PROCEDURES:  Critical Care performed: No  Procedures   MEDICATIONS ORDERED IN ED: Medications  ibuprofen (ADVIL) tablet 600 mg (600 mg Oral Given 02/25/24 0749)     IMPRESSION / MDM / ASSESSMENT AND PLAN / ED COURSE  I reviewed the triage vital signs and the nursing notes.                              Differential diagnosis includes, but is not limited to, blunt injury primarily to the chest  No cervical tenderness.  Nexus negative for cervical fracture.  Negative [place] head CT (except age).  Patient however denies any headache or neck pain as well.  He is fully awake alert and oriented normocephalic atraumatic.  Very reproducible tenderness along the left posterior mid thoracic region with pleuritic pain as well.  Consistent with x-ray finding of rib fracture without pneumothorax.   No associated abdominal symptoms.  No neurologic symptoms.  No use of anticoagulant  Patient's presentation is most consistent with acute complicated illness / injury requiring diagnostic workup.   Discussed with the patient and his wife, he does not wish for any prescription pain medication.  I actually encouraged and offered to prescribe additional medication for pain, but patient reports that he would like to use Tylenol  and ibuprofen.  He will follow-up with Dr. Willam Harm off if anything else is needed.  Discussed careful return precautions.  Provided incentive spirometer  Return precautions and treatment recommendations and follow-up discussed with the patient who is agreeable with the plan.       FINAL CLINICAL IMPRESSION(S) / ED DIAGNOSES   Final diagnoses:  Closed fracture of multiple ribs of left side, initial encounter     Rx / DC Orders   ED Discharge Orders     None        Note:  This document was prepared using Dragon voice recognition software and may include unintentional dictation errors.   Iver Marker, MD 02/25/24 [phone]

## 2024-02-25 NOTE — ED Triage Notes (Signed)
 Pt reports falling from bed last night, pt c/o left upper rib pain on his back side. Pt states he has some bruising. Pt has small bruise to side of left eye also. Pt is not on blood thinners

## 2024-02-25 NOTE — ED Notes (Signed)
 See triage note Presents s/p fall  States fell out of bed last pm  Hitting left rib area and upper back  Some bruising noted  Family at bedside

## 2024-03-09 ENCOUNTER — Encounter: Payer: Self-pay | Admitting: Urology

## 2024-03-09 ENCOUNTER — Ambulatory Visit (INDEPENDENT_AMBULATORY_CARE_PROVIDER_SITE_OTHER): Admitting: Urology

## 2024-03-09 VITALS — BP 138/79 | HR 62 | Ht 72.0 in | Wt 180.0 lb

## 2024-03-09 DIAGNOSIS — R339 Retention of urine, unspecified: Secondary | ICD-10-CM | POA: Diagnosis not present

## 2024-03-09 DIAGNOSIS — N312 Flaccid neuropathic bladder, not elsewhere classified: Secondary | ICD-10-CM

## 2024-03-09 NOTE — Progress Notes (Signed)
 I, Maysun Jamey Mccallum, acting as a scribe for Geraline Knapp, MD., have documented all relevant documentation on the behalf of Geraline Knapp, MD, as directed by Geraline Knapp, MD while in the presence of Geraline Knapp, MD.  03/09/2024 6:30 PM   Benjamin Deleon 06-28-1941 [id number]  Referring provider: Nestor Banter, MD [phone] [address] [facility] [place],  [place] [address]  Chief Complaint  Patient presents with   Urinary Retention   Urologic history 1. 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 male presents for annual follow-up.   No complaints since last visit,  Continues CIC without difficulty No urinary tract infections.  Denies flank, abdominal, or pelvic pain.    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 Mild atypia, limited margins free.   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 deltoid. Mild atypia, limited margins free.   Dysplastic nevus 06/24/2021   right mid back 3 cm lateral to spine, mod atypia   Dysplastic nevus 01/08/2023   Mid frontal scalp. Moderate atypia, superimposed on SK, margins free.   GERD (gastroesophageal reflux disease)    Polymyalgia rheumatica (HCC)    Sleep apnea    uses dental appliance   Wears hearing aid in both ears     Surgical History: Past Surgical History:  Procedure Laterality Date   CATARACT EXTRACTION W/PHACO Right 02/22/2020   Procedure: CATARACT EXTRACTION PHACO AND INTRAOCULAR LENS PLACEMENT (IOC) RIGHT 14.63 01:24.4 17.3%;  Surgeon: Annell Kidney, MD;  Location: [facility];  Service: Ophthalmology;  Laterality: Right;  sleep apnea   CATARACT EXTRACTION W/PHACO Left 03/21/2020   Procedure:  CATARACT EXTRACTION PHACO AND INTRAOCULAR LENS PLACEMENT (IOC) LEFT 8.85  01:05.1  13.6%;  Surgeon: Annell Kidney, MD;  Location: [facility];  Service: Ophthalmology;  Laterality: Left;   COLONOSCOPY      Home Medications:  Allergies as of 03/09/2024       Reactions   Lactose Other (See Comments)   Gas, bloating   Lactose Intolerance (gi) Diarrhea   Bloating    Wound Dressing Adhesive Itching   Molnupiravir Rash        Medication List        Accurate as of March 09, 2024  6:30 PM. If you have any questions, ask your nurse or doctor.          amitriptyline 10 MG tablet Commonly known as: ELAVIL Take by mouth.   diclofenac Sodium 1 % Gel Commonly known as: VOLTAREN APPLY TOPICALLY 3 TIMES A  DAY AS NEEDED   escitalopram 10 MG tablet Commonly known as: LEXAPRO Take 10 mg by mouth daily.   famotidine 10 MG tablet Commonly known as: PEPCID Take 5 mg by mouth 2 (two) times daily.   finasteride 5 MG tablet Commonly known as: PROSCAR Take 1 tablet by mouth daily.   ketoconazole  2 % shampoo Commonly known as: NIZORAL  Shampoo onto skin from the waist up let sit a few minutes then wash off. Use twice per week for two months. After two months use Q4W to prevent recurrence.   Lagevrio 200 MG Caps capsule Generic drug: molnupiravir EUA Take 4 capsules by mouth 2 (two) times daily.  methotrexate 2.5 MG tablet Commonly known as: RHEUMATREX Take by mouth.   mometasone  0.1 % cream Commonly known as: ELOCON  APPLY 1 APPLICATION. TOPICALLY AS DIRECTED. APPLY TO LOWER LEGS, ANKLES AND TOPS OF FEET EVERY DAY 5 DAYS A WEEK UNTIL CLEAR, THEN AS NEEDED FLARES   mometasone  0.1 % lotion Commonly known as: ELOCON  Apply topically 3 (three) times a week. Apply to aa itchy scalp 3 nights a week, Monday, Wednesday, Friday prn flares   simvastatin 5 MG tablet Commonly known as: ZOCOR Take 5 mg by mouth daily.   tacrolimus  0.1 % ointment Commonly known as:  PROTOPIC  Apply topically as directed. qd to bid to aa eczema on right flank and hip until clear then prn flares   Vitamin D3 50 MCG (2000 UT) Tabs Take by mouth daily.        Allergies:  Allergies  Allergen Reactions   Lactose Other (See Comments)    Gas, bloating   Lactose Intolerance (Gi) Diarrhea    Bloating    Wound Dressing Adhesive Itching   Molnupiravir Rash    Family History: Family History  Problem Relation Age of Onset   Bladder Cancer Father     Social History:  reports that he has never smoked. He has never used smokeless tobacco. He reports that he does not currently use alcohol. He reports that he does not use drugs.   Physical Exam: BP 138/79   Pulse 62   Ht 6' (1.829 m)   Wt 180 lb (81.6 kg)   BMI 24.41 kg/m   Constitutional:  Alert and oriented, No acute distress. HEENT: [place] AT Respiratory: Normal respiratory effort, no increased work of breathing. Psychiatric: Normal mood and affect.   Assessment & Plan:    1. 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  I have reviewed the above documentation for accuracy and completeness, and I agree with the above.   Geraline Knapp, MD  [facility] [address], [address] [place], [place] [address] [phone]

## 2024-03-10 ENCOUNTER — Encounter: Payer: Self-pay | Admitting: Dermatology

## 2024-03-10 ENCOUNTER — Ambulatory Visit: Admitting: Dermatology

## 2024-03-10 DIAGNOSIS — L820 Inflamed seborrheic keratosis: Secondary | ICD-10-CM | POA: Diagnosis not present

## 2024-03-10 DIAGNOSIS — C8400 Mycosis fungoides, unspecified site: Secondary | ICD-10-CM

## 2024-03-10 DIAGNOSIS — Z79899 Other long term (current) drug therapy: Secondary | ICD-10-CM

## 2024-03-10 NOTE — Patient Instructions (Signed)

## 2024-03-10 NOTE — Progress Notes (Signed)
   Follow-Up Visit   Subjective  Benjamin Deleon is a 83 y.o. male who presents for the following: reports a spot at left forehead that has been growing also here today for follow up on previous bx at flank he would like to discuss the results    The patient has spots, moles and lesions to be evaluated, some may be new or changing and the patient may have concern these could be cancer.   The following portions of the chart were reviewed this encounter and updated as appropriate: medications, allergies, medical history  Review of Systems:  No other skin or systemic complaints except as noted in HPI or Assessment and Plan.  Objective  Well appearing patient in no apparent distress; mood and affect are within normal limits.   A focused examination was performed of the following areas: Scalp, face, chest, arms  Relevant exam findings are noted in the Assessment and Plan.  left forehead  x 1 Erythematous stuck-on, waxy papule or plaque  Assessment & Plan   MYCOSIS FUNGOIDES, UNSPECIFIED BODY REGION (HCC)  Chronic flaring but stable not at patient goal  Exam pink subtly scaly plaques on R flank, L upper medial arm. Lesions on thighs in the past, not examined today. No LAD (head/neck, axillary, clavicular, inguinal, popliteal, epitrochlear)   Reviewed outside labs: 03/08/24 CBC diff CMP  Plan: Stage 1A: patches < 10% BSA and no lymphadenopathy - discussed pathogenesis and staging of MF. Oncology is not needed at Stage 1A but we will refer patient if desired - patient will get blood draw for Szary syndrome test - continue mometasone  BID until clear or until pruritus stops Avoid applying to face, groin, and axilla. Use as directed. Long-term use can cause thinning of the skin. INFLAMED SEBORRHEIC KERATOSIS left forehead  x 1 Symptomatic, irritating, patient would like treated. Destruction of lesion - left forehead  x 1 Complexity: simple   Destruction method: cryotherapy    Informed consent: discussed and consent obtained   Timeout:  patient name, date of birth, surgical site, and procedure verified Lesion destroyed using liquid nitrogen: Yes   Region frozen until ice ball extended beyond lesion: Yes   Cryo cycles: 1 or 2. Outcome: patient tolerated procedure well with no complications   Post-procedure details: wound care instructions given   MEDICATION MANAGEMENT    Return in about 6 months (around 09/09/2024) for TBSE.  I, Randee Busing, CMA, am acting as scribe for Harris Liming, MD.   Documentation: I have reviewed the above documentation for accuracy and completeness, and I agree with the above.  Harris Liming, MD

## 2024-03-14 ENCOUNTER — Encounter: Payer: Self-pay | Admitting: Podiatry

## 2024-03-14 ENCOUNTER — Ambulatory Visit (INDEPENDENT_AMBULATORY_CARE_PROVIDER_SITE_OTHER): Admitting: Podiatry

## 2024-03-14 DIAGNOSIS — D689 Coagulation defect, unspecified: Secondary | ICD-10-CM | POA: Diagnosis not present

## 2024-03-14 DIAGNOSIS — M79674 Pain in right toe(s): Secondary | ICD-10-CM

## 2024-03-14 DIAGNOSIS — B351 Tinea unguium: Secondary | ICD-10-CM

## 2024-03-14 DIAGNOSIS — M79675 Pain in left toe(s): Secondary | ICD-10-CM

## 2024-03-20 NOTE — Progress Notes (Signed)
  Subjective:  Patient ID: Benjamin Deleon, male    DOB: 04/08/1941,  MRN: [id number]  Benjamin Deleon presents to clinic today for at risk foot care with h/o clotting disorder and painful elongated mycotic toenails 1-5 bilaterally which are tender when wearing enclosed shoe gear. Pain is relieved with periodic professional debridement.  Chief Complaint  Patient presents with   Nail Problem    Thick, painful toenails, 3 month follow up   New problem(s): None.   PCP is Benjamin Banter, MD. Benjamin Deleon 03/01/2024.  Allergies  Allergen Reactions   Lactose Other (See Comments)    Gas, bloating   Lactose Intolerance (Gi) Diarrhea    Bloating    Wound Dressing Adhesive Itching   Molnupiravir Rash    Review of Systems: Negative except as noted in the HPI.  Objective: No changes noted in today's physical examination. There were no vitals filed for this visit. Benjamin Deleon is a pleasant 83 y.o. male WD, WN in NAD. AAO x 3.  Vascular Examination: Capillary refill time immediate b/l. Palpable pedal pulses. Pedal hair present b/l. No pain with calf compression b/l. Skin temperature gradient WNL b/l. No cyanosis or clubbing b/l. No ischemia or gangrene noted b/l. No edema noted b/l LE.  Neurological Examination: Sensation grossly intact b/l with 10 gram monofilament. Vibratory sensation intact b/l.   Dermatological Examination: Pedal skin with normal turgor, texture and tone b/l.  No open wounds. No interdigital macerations.   Toenails 1-5 b/l thick, discolored, elongated with subungual debris and pain on dorsal palpation.   No hyperkeratotic nor porokeratotic lesions present on today's visit.  Musculoskeletal Examination: Muscle strength 5/5 to all lower extremity muscle groups bilaterally. No pain, crepitus or joint limitation noted with ROM b/l LE. No gross bony pedal deformities b/l.   Radiographs: None  Assessment/Plan: 1. Pain due to onychomycosis of toenails of both feet   2. Clotting  disorder (HCC)   -Consent given for treatment as described below: -Examined patient. -Continue supportive shoe gear daily. -Mycotic toenails 1-5 bilaterally were debrided in length and girth with sterile nail nippers and dremel. Pinpoint bleeding of L hallux addressed with Lumicain Hemostatic Solution, cleansed with alcohol. No further treatment required by patient/caregiver. -Patient/POA to call should there be question/concern in the interim.   Return in about 9 weeks (around 05/16/2024).  Benjamin Deleon, DPM      [place] LOCATION: [address], [place] [address]                   Office [phone]   [facility] LOCATION: [address] [place], [place] [address] Office [phone]

## 2024-04-18 ENCOUNTER — Encounter: Payer: Self-pay | Admitting: Dermatology

## 2024-04-20 NOTE — Telephone Encounter (Signed)
 Called [facility] and confirmed re draw has not be done at this time. I have called the patient and left VM to please return my call. aw

## 2024-04-20 NOTE — Telephone Encounter (Signed)
 There is a comment in the req stating Comment: Test not performed. Deterioration occurred during specimen handling. [facility] is providing the patient with re-collection instructions.   Do we know if the patient went for retest?

## 2024-04-26 NOTE — Telephone Encounter (Signed)
 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 aw

## 2024-04-27 ENCOUNTER — Other Ambulatory Visit: Payer: Self-pay | Admitting: Dermatology

## 2024-04-27 DIAGNOSIS — C8400 Mycosis fungoides, unspecified site: Secondary | ICD-10-CM

## 2024-04-27 NOTE — Telephone Encounter (Signed)
 Spoken to [employer] and walked over to [employer] office. Patient has had no second redraw with results. Dr. Claudene has placed new req for labs to be done again tomorrow. aw

## 2024-05-05 LAB — COMP PANEL: LEUKEMIA/LYMPHOMA

## 2024-05-09 ENCOUNTER — Ambulatory Visit: Payer: Self-pay | Admitting: Dermatology

## 2024-05-09 ENCOUNTER — Encounter: Payer: Self-pay | Admitting: Dermatology

## 2024-06-05 ENCOUNTER — Other Ambulatory Visit: Payer: Self-pay | Admitting: Dermatology

## 2024-06-05 DIAGNOSIS — L2081 Atopic neurodermatitis: Secondary | ICD-10-CM

## 2024-06-16 ENCOUNTER — Ambulatory Visit: Admitting: Podiatry

## 2024-07-07 ENCOUNTER — Ambulatory Visit: Admitting: Dermatology

## 2024-07-07 ENCOUNTER — Ambulatory Visit: Payer: Medicare Other | Admitting: Dermatology

## 2024-07-11 ENCOUNTER — Ambulatory Visit (INDEPENDENT_AMBULATORY_CARE_PROVIDER_SITE_OTHER): Admitting: Podiatry

## 2024-07-11 DIAGNOSIS — M79675 Pain in left toe(s): Secondary | ICD-10-CM | POA: Diagnosis not present

## 2024-07-11 DIAGNOSIS — M79674 Pain in right toe(s): Secondary | ICD-10-CM | POA: Diagnosis not present

## 2024-07-11 DIAGNOSIS — B351 Tinea unguium: Secondary | ICD-10-CM

## 2024-07-11 DIAGNOSIS — D689 Coagulation defect, unspecified: Secondary | ICD-10-CM

## 2024-07-17 ENCOUNTER — Encounter: Payer: Self-pay | Admitting: Podiatry

## 2024-07-17 NOTE — Progress Notes (Signed)
  Subjective:  Patient ID: Benjamin Deleon, male    DOB: September 18, 1941,  MRN: [id number]  Benjamin Deleon presents to clinic today for at risk foot care with h/o clotting disorder and painful thick toenails that are difficult to trim. Pain interferes with ambulation. Aggravating factors include wearing enclosed shoe gear. Pain is relieved with periodic professional debridement.  Chief Complaint  Patient presents with   Toe Pain    RFC. Dr. Diedra is his PCP. Last office visit was in June   New problem(s): None.   PCP is Diedra Lame, MD.  Allergies  Allergen Reactions   Lactose Other (See Comments)    Gas, bloating   Lactose Intolerance (Gi) Diarrhea    Bloating    Wound Dressing Adhesive Itching   Molnupiravir Rash    Review of Systems: Negative except as noted in the HPI.  Objective: No changes noted in today's physical examination. There were no vitals filed for this visit. Benjamin Deleon is a pleasant 83 y.o. male WD, WN in NAD. AAO x 3.  Neurovascular status intact bilaterally and symmetrically.  Dermatological Examination: Pedal skin with normal turgor, texture and tone b/l.  No open wounds. No interdigital macerations.   Toenails 1-5 b/l thick, discolored, elongated with subungual debris and pain on dorsal palpation.   No hyperkeratotic nor porokeratotic lesions present on today's visit.  Musculoskeletal Examination: Muscle strength 5/5 to all lower extremity muscle groups bilaterally. No pain, crepitus or joint limitation noted with ROM b/l LE. No gross bony pedal deformities b/l.   Radiographs: None  Assessment/Plan: 1. Pain due to onychomycosis of toenails of both feet   2. Clotting disorder   Patient was evaluated and treated. All patient's and/or POA's questions/concerns addressed on today's visit. Toenails 1-5 debrided in length and girth without incident. Continue soft, supportive shoe gear daily. Report any pedal injuries to medical professional. Call office if  there are any questions/concerns. -Patient/POA to call should there be question/concern in the interim.   Return in about 3 months (around 10/11/2024).  [name], DPM      [place] LOCATION: [address] [place] [address]                   Office [phone]   [facility] LOCATION: [address] [place], [place] [address] Office [phone]

## 2024-09-08 ENCOUNTER — Encounter: Payer: Self-pay | Admitting: Dermatology

## 2024-09-08 ENCOUNTER — Ambulatory Visit: Admitting: Dermatology

## 2024-09-08 DIAGNOSIS — Z86018 Personal history of other benign neoplasm: Secondary | ICD-10-CM

## 2024-09-08 DIAGNOSIS — L821 Other seborrheic keratosis: Secondary | ICD-10-CM

## 2024-09-08 DIAGNOSIS — L739 Follicular disorder, unspecified: Secondary | ICD-10-CM

## 2024-09-08 DIAGNOSIS — C8400 Mycosis fungoides, unspecified site: Secondary | ICD-10-CM

## 2024-09-08 DIAGNOSIS — D1801 Hemangioma of skin and subcutaneous tissue: Secondary | ICD-10-CM

## 2024-09-08 DIAGNOSIS — D229 Melanocytic nevi, unspecified: Secondary | ICD-10-CM

## 2024-09-08 DIAGNOSIS — Z1283 Encounter for screening for malignant neoplasm of skin: Secondary | ICD-10-CM | POA: Diagnosis not present

## 2024-09-08 DIAGNOSIS — W908XXA Exposure to other nonionizing radiation, initial encounter: Secondary | ICD-10-CM

## 2024-09-08 DIAGNOSIS — L578 Other skin changes due to chronic exposure to nonionizing radiation: Secondary | ICD-10-CM | POA: Diagnosis not present

## 2024-09-08 DIAGNOSIS — Z79899 Other long term (current) drug therapy: Secondary | ICD-10-CM

## 2024-09-08 DIAGNOSIS — L814 Other melanin hyperpigmentation: Secondary | ICD-10-CM | POA: Diagnosis not present

## 2024-09-08 DIAGNOSIS — L219 Seborrheic dermatitis, unspecified: Secondary | ICD-10-CM

## 2024-09-08 MED ORDER — KETOCONAZOLE 2 % EX SHAM: 1.0000 | MEDICATED_SHAMPOO | CUTANEOUS | 11 refills | Status: AC

## 2024-09-08 MED ORDER — CLOBETASOL PROPIONATE 0.05 % EX CREA: 1.0000 | TOPICAL_CREAM | CUTANEOUS | 4 refills | Status: AC

## 2024-09-08 NOTE — Patient Instructions (Addendum)
 For Folliculitis scalp, neck Hibiclens to wash scalp/body daily in shower, avoid eyes     Due to recent changes in healthcare laws, you may see results of your pathology and/or laboratory studies on MyChart before the doctors have had a chance to review them. We understand that in some cases there may be results that are confusing or concerning to you. Please understand that not all results are received at the same time and often the doctors may need to interpret multiple results in order to provide you with the best plan of care or course of treatment. Therefore, we ask that you please give us  2 business days to thoroughly review all your results before contacting the office for clarification. Should we see a critical lab result, you will be contacted sooner.   If You Need Anything After Your Visit  If you have any questions or concerns for your doctor, please call our main line at [phone] and press option 4 to reach your doctor's medical assistant. If no one answers, please leave a voicemail as directed and we will return your call as soon as possible. Messages left after 4 pm will be answered the following business day.   You may also send us  a message via MyChart. We typically respond to MyChart messages within 1-2 business days.  For prescription refills, please ask your pharmacy to contact our office. Our fax number is [phone].  If you have an urgent issue when the clinic is closed that cannot wait until the next business day, you can page your doctor at the number below.    Please note that while we do our best to be available for urgent issues outside of office hours, we are not available 24/7.   If you have an urgent issue and are unable to reach us , you may choose to seek medical care at your doctor's office, retail clinic, urgent care center, or emergency room.  If you have a medical emergency, please immediately call 911 or go to the emergency department.  Pager  Numbers  - Dr. Hester: [phone]  - Dr. Jackquline: [phone]  - Dr. Claudene: [phone]   - Dr. Raymund: [phone]  In the event of inclement weather, please call our main line at [phone] for an update on the status of any delays or closures.  Dermatology Medication Tips: Please keep the boxes that topical medications come in in order to help keep track of the instructions about where and how to use these. Pharmacies typically print the medication instructions only on the boxes and not directly on the medication tubes.   If your medication is too expensive, please contact our office at [phone] option 4 or send us  a message through MyChart.   We are unable to tell what your co-pay for medications will be in advance as this is different depending on your insurance coverage. However, we may be able to find a substitute medication at lower cost or fill out paperwork to get insurance to cover a needed medication.   If a prior authorization is required to get your medication covered by your insurance company, please allow us  1-2 business days to complete this process.  Drug prices often vary depending on where the prescription is filled and some pharmacies may offer cheaper prices.  The website www.goodrx.com contains coupons for medications through different pharmacies. The prices here do not account for what the cost may be with help from insurance (it may be cheaper with your insurance), but the website can  give you the price if you did not use any insurance.  - You can print the associated coupon and take it with your prescription to the pharmacy.  - You may also stop by our office during regular business hours and pick up a GoodRx coupon card.  - If you need your prescription sent electronically to a different pharmacy, notify our office through [facility] or by phone at [phone] option 4.     Si Usted Necesita Algo Despus de Su Visita  Tambin puede  enviarnos un mensaje a travs de Clinical Cytogeneticist. Por lo general respondemos a los mensajes de MyChart en el transcurso de 1 a 2 das hbiles.  Para renovar recetas, por favor pida a su farmacia que se ponga en contacto con nuestra oficina. Randi lakes de fax es [place] [phone].  Si tiene un asunto urgente cuando la clnica est cerrada y que no puede esperar hasta el siguiente da hbil, puede llamar/localizar a su doctor(a) al nmero que aparece a continuacin.   Por favor, tenga en cuenta que aunque hacemos todo lo posible para estar disponibles para asuntos urgentes fuera del horario de [place], no estamos disponibles las 24 horas del da, los 7 [address]  [phone] de la [place].   Si tiene un problema urgente y no puede comunicarse con nosotros, puede optar por buscar atencin mdica  en el consultorio de su doctor(a), en una clnica privada, en un centro de atencin urgente o en una sala de emergencias.  Si tiene engineer, drilling, por favor llame inmediatamente al 911 o vaya a la sala de emergencias.  Nmeros de bper  - Dr. Hester: [phone]  - Dra. Jackquline: [phone]  - Dr. Claudene: [phone]  - Dra. Kitts: [phone]  En caso de inclemencias del [place], por favor llame a nuestra lnea principal al [phone] para una actualizacin sobre el estado de cualquier retraso o cierre.  Consejos para la medicacin en dermatologa: Por favor, guarde las cajas en las que vienen los medicamentos de uso tpico para ayudarle a seguir las instrucciones sobre dnde y cmo usarlos. Las farmacias generalmente imprimen las instrucciones del medicamento slo en las cajas y no directamente en los tubos del [place].   Si su medicamento es muy caro, por favor, pngase en contacto con landry rieger llamando al [phone] y presione la opcin 4 o envenos un mensaje a travs de Clinical Cytogeneticist.   No podemos decirle cul ser su copago por los medicamentos por adelantado ya que esto es diferente dependiendo  de la cobertura de su seguro. Sin embargo, es posible que podamos encontrar un medicamento sustituto a audiological scientist un formulario para que el seguro cubra el medicamento que se considera necesario.   Si se requiere una autorizacin previa para que su compaa de seguros cubra su medicamento, por favor permtanos de 1 a 2 das hbiles para completar este proceso.  Los precios de los medicamentos varan con frecuencia dependiendo del environmental consultant de dnde se surte la receta y alguna farmacias pueden ofrecer precios ms baratos.  El sitio web www.goodrx.com tiene cupones para medicamentos de health and safety inspector. Los precios aqu no tienen en cuenta lo que podra costar con la ayuda del seguro (puede ser ms barato con su seguro), pero el sitio web puede darle el precio si no utiliz tourist information centre manager.  - Puede imprimir el cupn correspondiente y llevarlo con su receta a la farmacia.  - Tambin puede pasar por nuestra oficina durante el horario de atencin regular y education officer, museum una tarjeta de  cupones de GoodRx.  - Si necesita que su receta se enve electrnicamente a una farmacia diferente, informe a nuestra oficina a travs de MyChart de [place] o por telfono llamando al [phone] y presione la opcin 4.

## 2024-09-08 NOTE — Progress Notes (Signed)
 Follow-Up Visit   Subjective  Benjamin Deleon is a 83 y.o. male who presents for the following: Skin Cancer Screening and Full Body Skin Exam hx of Dysplastic Nevi, Seb derm scalp, needs refill of Ketoconazole  2% shampoo, Mycosis Fungoides bil flanks, neck, legs, Cerave Cream, Mometasone  cr prn  The patient presents for Total-Body Skin Exam (TBSE) for skin cancer screening and mole check. The patient has spots, moles and lesions to be evaluated, some may be new or changing and the patient may have concern these could be cancer.    The following portions of the chart were reviewed this encounter and updated as appropriate: medications, allergies, medical history  Review of Systems:  No other skin or systemic complaints except as noted in HPI or Assessment and Plan.  Objective  Well appearing patient in no apparent distress; mood and affect are within normal limits.  A full examination was performed including scalp, head, eyes, ears, nose, lips, neck, chest, axillae, abdomen, back, buttocks, bilateral upper extremities, bilateral lower extremities, hands, feet, fingers, toes, fingernails, and toenails. All findings within normal limits unless otherwise noted below.   Relevant physical exam findings are noted in the Assessment and Plan.    Assessment & Plan   SKIN CANCER SCREENING PERFORMED TODAY.  ACTINIC DAMAGE - Chronic condition, secondary to cumulative UV/sun exposure - diffuse scaly erythematous macules with underlying dyspigmentation - Recommend daily broad spectrum sunscreen SPF 30+ to sun-exposed areas, reapply every 2 hours as needed.  - Staying in the shade or wearing long sleeves, sun glasses (UVA+UVB protection) and wide brim hats (4-inch brim around the entire circumference of the hat) are also recommended for sun protection.  - Call for new or changing lesions.  LENTIGINES, SEBORRHEIC KERATOSES, HEMANGIOMAS - Benign normal skin lesions - Benign-appearing - Call for  any changes  MELANOCYTIC NEVI - Tan-brown and/or pink-flesh-colored symmetric macules and papules - Benign appearing on exam today - Observation - Call clinic for new or changing moles - Recommend daily use of broad spectrum spf 30+ sunscreen to sun-exposed areas.   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 Condition is bothersome/symptomatic for patient. Currently flared.   Treatment Plan: D/C Mometasone  Start Clobetasol cr bid aa until clear or until itching resolved, avoid f/g/a R axillary ultrasound given fullness but no lymphadenopathy head and neck axillary clavicular popliteal  Topical steroids (such as triamcinolone, fluocinolone, fluocinonide, mometasone , clobetasol, halobetasol, betamethasone, hydrocortisone) can cause thinning and lightening of the skin if they are used for too long in the same area. Your physician has selected the right strength medicine for your problem and area affected on the body. Please use your medication only as directed by your physician to prevent side effects.   Long term medication management.  Patient is using long term (months to years) prescription medication  to control their dermatologic condition.  These medications require periodic monitoring to evaluate for efficacy and side effects and may require periodic laboratory monitoring.   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  Call if any new or changing lesions are noted between office visits  - L proximal tricep, R distal lat deltoid, R mid back 3.0cm lat to spine, Mid frontal scalp  SEBORRHEIC DERMATITIS scalp  Exam: Pink patches with scale at scalp  Chronic and  persistent condition with duration or expected duration over one year. Condition is symptomatic / bothersome to patient. Not to goal.  Seborrheic Dermatitis is a chronic persistent rash characterized by pinkness and scaling most commonly of the mid face but also can occur on the scalp (dandruff), ears; mid chest, mid back and groin.  It tends to be exacerbated by stress and cooler weather.  People who have neurologic disease may experience new onset or exacerbation of existing seborrheic dermatitis.  The condition is not curable but treatable and can be controlled.  Treatment Plan: Cont Ketoconazole  2% shampoo 3x/wk, let sit 5 minutes and rinse out  Long term medication management.  Patient is using long term (months to years) prescription medication  to control their dermatologic condition.  These medications require periodic monitoring to evaluate for efficacy and side effects and may require periodic laboratory monitoring.    FOLLICULITIS Neck, upper trunk, clear on occipital scalp Exam: Perifollicular erythematous papules and pustules  Folliculitis occurs due to inflammation of the superficial hair follicle (pore), resulting in acne-like lesions (pus bumps). It can be infectious (bacterial, fungal) or noninfectious (shaving, tight clothing, heat/sweat, medications).  Folliculitis can be acute or chronic and recommended treatment depends on the underlying cause of folliculitis.  Treatment Plan: Start Hibiclens qd to scalp/body in shower as body wash, avoid eyes  MYCOSIS FUNGOIDES, UNSPECIFIED BODY REGION (HCC)   Related Procedures US  AXILLA RIGHT MULTIPLE BENIGN NEVI   LENTIGINES   ACTINIC ELASTOSIS   SEBORRHEIC KERATOSES   CHERRY ANGIOMA   FOLLICULITIS   SEBORRHEIC DERMATITIS   Return in about 3 months (around 12/07/2024) for mycosis fungoides f/u, 1 yr tBSE, Hx of Dysplastic nevi.  I, Grayce Saunas, RMA, am acting as scribe for Boneta Sharps, MD  .   Documentation: I have reviewed the above documentation for accuracy and completeness, and I agree with the above.  Boneta Sharps, MD

## 2024-09-22 ENCOUNTER — Ambulatory Visit (HOSPITAL_COMMUNITY): Admission: RE | Admit: 2024-09-22 | Discharge: 2024-09-22 | Attending: Dermatology | Admitting: Dermatology

## 2024-09-22 DIAGNOSIS — C8400 Mycosis fungoides, unspecified site: Secondary | ICD-10-CM | POA: Insufficient documentation

## 2024-09-25 ENCOUNTER — Encounter: Payer: Self-pay | Admitting: Urology

## 2024-10-09 ENCOUNTER — Ambulatory Visit: Payer: Self-pay | Admitting: Dermatology

## 2024-10-10 ENCOUNTER — Encounter: Payer: Self-pay | Admitting: Urology

## 2024-10-13 ENCOUNTER — Ambulatory Visit: Admitting: Podiatry

## 2024-10-17 ENCOUNTER — Encounter: Payer: Self-pay | Admitting: Podiatry

## 2024-10-17 ENCOUNTER — Ambulatory Visit (INDEPENDENT_AMBULATORY_CARE_PROVIDER_SITE_OTHER): Admitting: Podiatry

## 2024-10-17 DIAGNOSIS — M79675 Pain in left toe(s): Secondary | ICD-10-CM | POA: Diagnosis not present

## 2024-10-17 DIAGNOSIS — B351 Tinea unguium: Secondary | ICD-10-CM

## 2024-10-17 DIAGNOSIS — D689 Coagulation defect, unspecified: Secondary | ICD-10-CM

## 2024-10-17 DIAGNOSIS — M79674 Pain in right toe(s): Secondary | ICD-10-CM | POA: Diagnosis not present

## 2024-10-24 NOTE — Progress Notes (Signed)
"  °  Subjective:  Patient ID: Benjamin Deleon, male    DOB: 04/27/1941,  MRN: [id number]  Benjamin Deleon presents to clinic today for at risk foot care with h/o clotting disorder and painful mycotic toenails x 10 which interfere with daily activities. Pain is relieved with periodic professional debridement.  Chief Complaint  Patient presents with   Diabetes    Denies being diabetic. He saw Dr. Diedra in Oct   New problem(s): None.   PCP is Diedra Lame, MD.  Allergies[1]  Review of Systems: Negative except as noted in the HPI.  Objective: No changes noted in today's physical examination. There were no vitals filed for this visit. Benjamin Deleon is a pleasant 84 y.o. male WD, WN in NAD. AAO x 3.  Neurovascular status intact bilaterally and symmetrically.  Dermatological Examination: Pedal skin with normal turgor, texture and tone b/l.  No open wounds. No interdigital macerations.   Toenails 1-5 b/l thick, discolored, elongated with subungual debris and pain on dorsal palpation.   No hyperkeratotic nor porokeratotic lesions present on today's visit.  Musculoskeletal Examination: Muscle strength 5/5 to all lower extremity muscle groups bilaterally. No pain, crepitus or joint limitation noted with ROM b/l LE. No gross bony pedal deformities b/l.   Radiographs: None  Assessment/Plan: 1. Pain due to onychomycosis of toenails of both feet   2. Clotting disorder   Consent given for treatment. Patient examined. All patient's and/or POA's questions/concerns addressed on today's visit. Mycotic toenails 1-5 b/l debrided in length and girth without incident. Continue soft, supportive shoe gear daily. Report any pedal injuries to medical professional. Call office if there are any quesitons/concerns. -Patient/POA to call should there be question/concern in the interim.   Return in about 3 months (around 01/15/2025).  Benjamin Deleon, DPM      [place] LOCATION: [address], [place] [address]                   Office [phone]   [facility] LOCATION: [address] [place], [place] [address] Office [phone]     [1]  Allergies Allergen Reactions   Lactose Other (See Comments)    Gas, bloating   Lactose Intolerance (Gi) Diarrhea    Bloating    Wound Dressing Adhesive Itching   Molnupiravir Rash   "

## 2024-12-08 ENCOUNTER — Ambulatory Visit: Admitting: Dermatology

## 2025-01-16 ENCOUNTER — Ambulatory Visit: Admitting: Podiatry

## 2025-03-09 ENCOUNTER — Ambulatory Visit: Admitting: Urology

## 2025-09-11 ENCOUNTER — Ambulatory Visit: Admitting: Dermatology
